# Patient Record
Sex: Male | Born: 1950 | Race: Black or African American | Hispanic: No | Marital: Married | Smoking: Former smoker
Health system: Southern US, Community
[De-identification: ages and names within clinical notes are randomized; demographics above are authoritative.]

## PROBLEM LIST (undated history)

## (undated) DIAGNOSIS — E785 Hyperlipidemia, unspecified: Secondary | ICD-10-CM

## (undated) DIAGNOSIS — J45909 Unspecified asthma, uncomplicated: Secondary | ICD-10-CM

## (undated) DIAGNOSIS — I10 Essential (primary) hypertension: Secondary | ICD-10-CM

## (undated) DIAGNOSIS — E119 Type 2 diabetes mellitus without complications: Secondary | ICD-10-CM

## (undated) DIAGNOSIS — G4730 Sleep apnea, unspecified: Secondary | ICD-10-CM

## (undated) DIAGNOSIS — R079 Chest pain, unspecified: Secondary | ICD-10-CM

## (undated) HISTORY — DX: Chest pain, unspecified: R07.9

## (undated) HISTORY — DX: Hyperlipidemia, unspecified: E78.5

## (undated) HISTORY — DX: Unspecified asthma, uncomplicated: J45.909

## (undated) HISTORY — DX: Essential (primary) hypertension: I10

## (undated) HISTORY — DX: Sleep apnea, unspecified: G47.30

---

## 2000-03-17 ENCOUNTER — Encounter: Payer: Self-pay | Admitting: Cardiology

## 2000-03-17 ENCOUNTER — Ambulatory Visit (HOSPITAL_COMMUNITY): Admission: RE | Admit: 2000-03-17 | Discharge: 2000-03-17 | Payer: Self-pay | Admitting: Cardiology

## 2003-09-20 ENCOUNTER — Emergency Department (HOSPITAL_COMMUNITY): Admission: EM | Admit: 2003-09-20 | Discharge: 2003-09-20 | Payer: Self-pay | Admitting: Emergency Medicine

## 2003-10-03 ENCOUNTER — Encounter (HOSPITAL_COMMUNITY): Admission: RE | Admit: 2003-10-03 | Discharge: 2003-11-02 | Payer: Self-pay | Admitting: Preventative Medicine

## 2003-10-08 ENCOUNTER — Encounter (HOSPITAL_COMMUNITY): Admission: RE | Admit: 2003-10-08 | Discharge: 2003-11-07 | Payer: Self-pay | Admitting: Preventative Medicine

## 2003-11-07 ENCOUNTER — Ambulatory Visit: Payer: Self-pay | Admitting: Family Medicine

## 2003-11-12 ENCOUNTER — Ambulatory Visit: Payer: Self-pay | Admitting: Family Medicine

## 2004-12-03 ENCOUNTER — Ambulatory Visit: Payer: Self-pay | Admitting: Family Medicine

## 2005-01-01 ENCOUNTER — Ambulatory Visit: Payer: Self-pay | Admitting: Family Medicine

## 2005-01-28 ENCOUNTER — Ambulatory Visit: Payer: Self-pay | Admitting: Cardiology

## 2005-02-05 ENCOUNTER — Ambulatory Visit: Payer: Self-pay | Admitting: Cardiology

## 2005-02-15 ENCOUNTER — Ambulatory Visit: Payer: Self-pay | Admitting: Cardiology

## 2005-02-17 ENCOUNTER — Inpatient Hospital Stay (HOSPITAL_BASED_OUTPATIENT_CLINIC_OR_DEPARTMENT_OTHER): Admission: RE | Admit: 2005-02-17 | Discharge: 2005-02-17 | Payer: Self-pay | Admitting: Cardiology

## 2005-02-17 ENCOUNTER — Ambulatory Visit: Payer: Self-pay | Admitting: Cardiology

## 2005-03-03 ENCOUNTER — Ambulatory Visit: Payer: Self-pay | Admitting: Cardiology

## 2005-08-13 ENCOUNTER — Ambulatory Visit: Payer: Self-pay | Admitting: Cardiology

## 2006-03-30 ENCOUNTER — Ambulatory Visit: Payer: Self-pay | Admitting: Family Medicine

## 2006-05-04 ENCOUNTER — Ambulatory Visit: Payer: Self-pay | Admitting: Cardiology

## 2006-05-05 ENCOUNTER — Ambulatory Visit: Payer: Self-pay | Admitting: Family Medicine

## 2006-06-23 ENCOUNTER — Ambulatory Visit: Payer: Self-pay | Admitting: Family Medicine

## 2010-05-22 NOTE — Assessment & Plan Note (Signed)
[facility]                            [facility] OFFICE NOTE   NAME:[name], [name]                        MRN:          [id number]  DATE:08/13/2005                            DOB:          [birth year]    HISTORY OF PRESENT ILLNESS:  The patient is a 60 year old, African-American  male who underwent recent catheterization.  He had no significant coronary  artery disease.  The patient does have known significant hypertension which  is under good control with Isoptin and hydrochlorothiazide.  The patient  denies any chest pain.  He has no recurrent palpitations.  His EKG does show  occasional PACs.  The patient is also in the process of being evaluated for  sleep apnea.  He presents for followup, and he has been doing well.   MEDICATIONS:  Flovent inhaler b.i.d., aspirin 81 mg a day,  hydrochlorothiazide 25 mg p.o. daily, Isoptin SR 120 mg p.o. daily.   PHYSICAL EXAMINATION:  VITAL SIGNS:  Blood pressure is 170/80, but  reportedly the patient's blood pressure was 135/80 last time when he was  seen in the clinic; heart rate was 78 beats per minute; weight is 275.  NECK EXAM:  Normal carotid upstroke.  No carotid bruits.  LUNGS:  Clear.  HEART:  Regular rate and rhythm.  Normal sinus rhythm.  No murmurs, rubs, or  gallops.  ABDOMEN:  Soft.  EXTREMITY EXAM:  No cyanosis, clubbing, or edema.  NEURO:  The patient is alert and oriented.  Grossly nonfocal.   EKG:  Normal sinus rhythm with PACs.   PROBLEM LIST:  1. Atypical substernal chest pain.      a.     Normal cardiac catheterization with normal coronary arteries.      b.     False-positive Cardiolite stress study.      c.     Normal ejection fraction.  2. Severe hypertension on hydrochlorothiazide and verapamil.  3. Palpitations, resolved (PACs by EKG).  4. Asthma.  5. Dyslipidemia.  6. Rule out sleep apnea.   PLAN:  1. The patient is to continue his current medical regimen.  He may  need      further adjustment of his blood pressure medications in the near      future, although his blood pressure during the last clinic visit was      well controlled.  The patient did not take his medications yet today.  2. The patient has no recurrent palpitations.  He does have PACs by EKG,      but this can be      treated medically.  3. The patient will have an ApneaLink done to rule out sleep apnea.                                   [name], MD, [facility]   GED/MedQ  DD:  08/13/2005  DT:  08/13/2005  Job #:  [id number]   cc:   Delaney Meigs, MD

## 2010-05-22 NOTE — Cardiovascular Report (Signed)
NAME:  [name], [name] NO.:  [id number]   MEDICAL RECORD NO.:  [id number]          PATIENT TYPE:  OIB   LOCATION:  1965                         FACILITY:  [facility]   PHYSICIAN:  Charlies Constable, M.D. [facility] DATE OF BIRTH:  1950/05/18   DATE OF PROCEDURE:  02/17/2005  DATE OF DISCHARGE:                              CARDIAC CATHETERIZATION   [name] is 60 years old and lives in [place] and works for [employer].  He recently has developed left-sided chest pain and had a Cardiolite scan  done by Dr. Andee Lineman, which showed an inferior scar but no definite ischemia.  He had a hypertensive response to exercise with the Myoview scan.  He also  has a history of hypertension.  Because of persistent symptoms and an  abnormal scan, Dr. Andee Lineman made arrangements for him to be evaluated in the  outpatient laboratory with cardiac catheterization.   PROCEDURE:  The procedure was performed via the right femoral artery using  an arterial sheath and 4 French preformed coronary catheters.  A front wall  arterial puncture was performed and Omnipaque contrast was used.  The  patient tolerated the procedure well and left the laboratory in satisfactory  condition.   RESULTS:  1.  The aortic pressure was 109/66 with a mean of 86.  2.  The left ventricular pressure was 109/10.  3.  Left main coronary artery:  The left main coronary artery was free of      significant disease.  4.  Left anterior descending artery:  The left anterior descending artery      gave rise to four diagonal branches and three septal perforators.  These      and the LAD proper were free of significant disease.  5.  Circumflex artery:  The circumflex artery gave rise to a ramus branch,      an atrial branch, a marginal branch and a posterolateral branch.  These      vessels were free of significant disease.  6.  Right coronary artery:  The right coronary artery was a large, dominant      vessel that gave rise to dual  posterior descending branches and two      posterolateral branches.  These vessels were free of significant      disease.  7.  Left ventriculogram:  The left ventriculogram performed in the RAO      projection showed good wall motion with no areas of hypokinesis.  The      inferior wall moved well.  The estimated ejection fraction was 55%.  8.  Distal aortogram:  A distal aortogram was performed that showed patent      renal arteries with no renal obstruction and no significant a aortoiliac      obstruction.   CONCLUSION:  Normal coronary angiography and left ventricular wall motion.   RECOMMENDATIONS:  Reassurance.  Will make arrangements for the patient to  see Dr. Andee Lineman in follow-up.  It does not appear to be cardiac in etiology,  and Dr. Andee Lineman can decide about further evaluation.  ______________________________  Charlies Constable, M.D. [facility]     BB/MEDQ  D:  02/17/2005  T:  02/17/2005  Job:  [id number]   cc:   Delaney Meigs, M.D.  Fax: [phone]   [name], M.D. [facility]  [address]  [address]  [place]  [place] [address]   Cardiopulmonary Lab

## 2010-05-22 NOTE — Assessment & Plan Note (Signed)
[facility]                          [facility] OFFICE NOTE   NAME:[name]                        MRN:          [id number]  DATE:05/04/2006                            DOB:          1950-06-27    REFERRING PHYSICIAN:  Delaney Meigs, M.D.   HISTORY OF PRESENT ILLNESS:  The patient is a pleasant African-American  male with a history of hypertensive heart disease.  The patient's blood  pressure is now under good control.  He has been ruled out for ischemic  heart disease and previous cardiac catheterizations within normal  limits.  The patient reports some cramping in the legs, but has no  evidence of claudication.  He still has no chart from the ApneaLink  referral.  We had our Link representative in the office today, and I  have asked her to make sure that the patient receives an apnea monitor  to rule out sleep apnea.   PHYSICAL EXAMINATION:  VITAL SIGNS:  Blood pressure 143/82, heart rate  69 beats per minute, weight 268 pounds.  NECK:  Normal carotid upstroke, no carotid bruits.  LUNGS:  Clear breath sounds bilaterally.  HEART:  Regular rate and rhythm, normal S1, S2.  No murmurs, rubs or  gallops.  ABDOMEN:  Soft, nontender.  No rebound tenderness or guarding.  Good  bowel sounds.  EXTREMITIES:  No cyanosis, clubbing or edema.   PROBLEM LIST:  1. Hypertensive heart disease.  2. Hypertension controlled.  3. No evidence of coronary artery disease.  4. Rule out sleep apnea.   PLAN:  1. The patient can follow up with [place] in six months.  2. The patient will see Dr. Lysbeth Galas tomorrow, and I have provided      records to give to Dr. Lysbeth Galas.  3. The patient can continue on his current medical regimen.  4. An ApneaLink monitor has been again ordered.     [name], MD,FACC     GED/MedQ  DD: 05/04/2006  DT: 05/04/2006  Job #: [id number]   cc:   Delaney Meigs, M.D.

## 2010-05-22 NOTE — Cardiovascular Report (Signed)
[place]  Patient:    [name], [name]                        MRN: [id number] Proc. Date: 03/17/00 Adm. Date:  [id number] Attending:  Ronaldo Miyamoto CC:         Colon Flattery, D.O.  Rollene Rotunda, M.D. [facility]  CV Laboratory   Cardiac Catheterization  INDICATIONS:  A 60 year old gentleman with recent chest pain.  There is positive family history.  He has a history of asthma.  The current study was done to assess coronary anatomy.  PROCEDURE: 1. Left heart catheterization. 2. Selective coronary arteriography. 3. Selective left ventriculography.  CARDIOLOGIST:  Arturo Morton. Riley Kill, M.D. [facility]  DESCRIPTION OF THE PROCEDURE:  The procedure was performed from the right femora artery using 6-French catheter.  He tolerated the procedure well without complications.  He was taken to the holding area in satisfactory clinical condition.  HEMODYNAMIC DATA: The central aortic pressure was 152/95.  LV pressure 161/23.No gradient on pullback across the aortic valve.  ANGIOGRAPHIC DATA:  The left main coronary artery was free of critical disease.  The left anterior descending artery courses to the apex.  There were three diagonal branches all of modest size.  No significant high-grade stenoses were noted.  The circumflex provided his large marginal branch and an A-V circumflex which bifurcated.  The distal circumflex was a relatively small caliber vessel.  The large marginal was free of significant disease.  The right coronary artery is a dominant vessel.  It provided a posterior descending and posterolateral branch.  There was perhaps a very mild tapered narrowing of 10 to 20% near the junction of the proximal and mid vessel.  This did not appear to be flow limiting and did not appear to be significant.  Ventriculography in the RAO projection reveals preserved global systolic function.  Ejection fraction was 74%.  No segmental abnormalities  or contractures were identified.  CONCLUSIONS: 1. Normal left ventricular function. 2. No high-grade coronary artery stenosis.  DISPOSITION:  Follow up with Dr. Antoine Poche and Dr. Dewaine Conger. DD:  03/17/00 TD:  03/17/00 Job: [id number]

## 2010-05-22 NOTE — Letter (Signed)
August 12, 2005     Occupational Health  [facility]   RE:  [name], [name]  MRN:  [id number]  /  DOB:  1950/07/20   Dear Carmelina Dane,   Mr. Trost is a 60 year old African-American male who was evaluated with  atypical chest pain.  A Cardiolite study was obtained, which showed an  inferior defect.  Based on this study, the patient was recommended to  undergo cardiac catheterization.  In retrospect, it appeared that this  Cardiolite study was a false-positive study.  His cardiac catheterization  was entirely within normal limits with no evidence of significant epicardial  coronary artery disease.  He also had normal ventricular volumes and normal  intracardiac pressures.  The patient did have significant hypertension, but  this was rapidly controlled under hydrochlorothiazide.  As noted above,  during the catheterization, it was found that his blood pressure was within  normal limits.  The patient plans to undergo an evaluation for sleep apnea  but has not reported any syncopal episodes or falling asleep while operating  a motorized vehicle.  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   If you have any further questions, please do not hesitate to contact me.   Sincerely,     [name], MD, [facility]   GED/MedQ  DD:  08/12/2005  DT:  08/12/2005  Job #:  [id number]

## 2013-03-06 ENCOUNTER — Encounter: Payer: Self-pay | Admitting: *Deleted

## 2013-03-07 ENCOUNTER — Encounter: Payer: Self-pay | Admitting: Cardiology

## 2013-03-07 ENCOUNTER — Ambulatory Visit (INDEPENDENT_AMBULATORY_CARE_PROVIDER_SITE_OTHER): Payer: BC Managed Care – PPO | Admitting: Cardiology

## 2013-03-07 VITALS — BP 153/94 | HR 60 | Ht 75.0 in | Wt 260.0 lb

## 2013-03-07 DIAGNOSIS — R002 Palpitations: Secondary | ICD-10-CM | POA: Insufficient documentation

## 2013-03-07 DIAGNOSIS — Z136 Encounter for screening for cardiovascular disorders: Secondary | ICD-10-CM

## 2013-03-07 NOTE — Patient Instructions (Signed)
Your physician recommends that you schedule a follow-up appointment in: 1 month with Dr. Harl Bowie. This appointment will be scheduled today before you leave.   Your physician recommends that you continue on your current medications as directed. Please refer to the Current Medication list given to you today.  Your physician has recommended that you wear an event monitor for 7 days. Event monitors are medical devices that record the heart's electrical activity. Doctors most often [place] these monitors to diagnose arrhythmias. Arrhythmias are problems with the speed or rhythm of the heartbeat. The monitor is a small, portable device. You can wear one while you do your normal daily activities. This is usually used to diagnose what is causing palpitations/syncope (passing out).

## 2013-03-07 NOTE — Progress Notes (Signed)
Clinical Summary Mr. Chandler is a 63 y.o.male seen today as a new patient, he is referred for palpitations.   1. Palpitations - feeling of heart fluttering. Started Jan 2015. No other associated symptoms.  - occurs more often at night, lasts just a few minutes. Occurs every few days. Can occur at rest or with activity. Chronic SOB he attributes to his asthma that is unchanged - drinks coffee 1-2 cups, drinks ice tea or pepsi with meals. No EtoH - currently on verapamil and Toprol, Toprol started just recently without much change in symptoms.    Past Medical History  Diagnosis Date  . Dyslipidemia   . Asthma   . Sleep apnea   . Hypertension      Allergies  Allergen Reactions  . Sulfa Antibiotics      Current Outpatient Prescriptions  Medication Sig Dispense Refill  . aspirin 81 MG tablet Take 81 mg by mouth daily.      . Cholecalciferol (VITAMIN D3) 2000 UNITS TABS Take 1 tablet by mouth daily.      . hydrochlorothiazide (HYDRODIURIL) 25 MG tablet Take 25 mg by mouth daily.      . meloxicam (MOBIC) 15 MG tablet Take 15 mg by mouth daily as needed for pain.      . metFORMIN (GLUCOPHAGE) 500 MG tablet Take 500 mg by mouth daily with breakfast.      . mometasone (ASMANEX) 220 MCG/INH inhaler Inhale 2 puffs into the lungs daily.      . verapamil (VERELAN PM) 240 MG 24 hr capsule Take 240 mg by mouth at bedtime.       No current facility-administered medications for this visit.     No past surgical history on file.   Allergies  Allergen Reactions  . Sulfa Antibiotics       No family history on file.   Social History Mr. Cybulski reports that he quit smoking about 26 years ago. His smoking use included Cigarettes. He started smoking about 37 years ago. He smoked 0.00 packs per day. He does not have any smokeless tobacco history on file. Mr. Gassman reports that he does not drink alcohol.   Review of Systems CONSTITUTIONAL: No weight loss, fever, chills, weakness  or fatigue.  HEENT: Eyes: No visual loss, blurred vision, double vision or yellow sclerae.No hearing loss, sneezing, congestion, runny nose or sore throat.  SKIN: No rash or itching.  CARDIOVASCULAR: per HPI RESPIRATORY: No shortness of breath, cough or sputum.  GASTROINTESTINAL: No anorexia, nausea, vomiting or diarrhea. No abdominal pain or blood.  GENITOURINARY: No burning on urination, no polyuria NEUROLOGICAL: No headache, dizziness, syncope, paralysis, ataxia, numbness or tingling in the extremities. No change in bowel or bladder control.  MUSCULOSKELETAL: No muscle, back pain, joint pain or stiffness.  LYMPHATICS: No enlarged nodes. No history of splenectomy.  PSYCHIATRIC: No history of depression or anxiety.  ENDOCRINOLOGIC: No reports of sweating, cold or heat intolerance. No polyuria or polydipsia.  [name]   Physical Examination p 60 bp 153/94 Wt 260 lbs BMI 33 Gen: resting comfortably, no acute distress HEENT: no scleral icterus, pupils equal round and reactive, no palptable cervical adenopathy,  CV: RRR, no m/r/g, no JVD, no carotid bruits Resp: Clear to auscultation bilaterally GI: abdomen is soft, non-tender, non-distended, normal bowel sounds, no hepatosplenomegaly MSK: extremities are warm, no edema.  Skin: warm, no rash Neuro:  no focal deficits Psych: appropriate affect   Diagnostic Studies 03/2000 Cath HEMODYNAMIC DATA: The central aortic  pressure was 152/95. LV pressure  161/23.No gradient on pullback across the aortic valve.  ANGIOGRAPHIC DATA:  The left main coronary artery was free of critical disease.  The left anterior descending artery courses to the apex. There were three  diagonal branches all of modest size. No significant high-grade stenoses were  noted.  The circumflex provided his large marginal Vaudie Engebretsen and an A-V circumflex which  bifurcated. The distal circumflex was a relatively small caliber vessel. The  large marginal was free of significant  disease.  The right coronary artery is a dominant vessel. It provided a posterior  descending and posterolateral Tamya Denardo. There was perhaps a very mild tapered  narrowing of 10 to 20% near the junction of the proximal and mid vessel. This  did not appear to be flow limiting and did not appear to be significant.  Ventriculography in the RAO projection reveals preserved global systolic  function. Ejection fraction was 74%. No segmental abnormalities or  contractures were identified.  CONCLUSIONS:  1. Normal left ventricular function.  2. No high-grade coronary artery stenosis.  DISPOSITION: Follow up with Dr. [name] and Dr. [name].  02/2005 Cath RESULTS:  1. The aortic pressure was 109/66 with a mean of 86.  2. The left ventricular pressure was 109/10.  3. Left main coronary artery: The left main coronary artery was free of  significant disease.  4. Left anterior descending artery: The left anterior descending artery  gave rise to four diagonal branches and three septal perforators. These  and the LAD proper were free of significant disease.  5. Circumflex artery: The circumflex artery gave rise to a ramus Jocelyne Reinertsen,  an atrial Sofiah Lyne, a marginal Doyt Castellana and a posterolateral Emerlyn Mehlhoff. These  vessels were free of significant disease.  6. Right coronary artery: The right coronary artery was a large, dominant  vessel that gave rise to dual posterior descending branches and two  posterolateral branches. These vessels were free of significant  disease.  7. Left ventriculogram: The left ventriculogram performed in the RAO  projection showed good wall motion with no areas of hypokinesis. The  inferior wall moved well. The estimated ejection fraction was 55%.  8. Distal aortogram: A distal aortogram was performed that showed patent  renal arteries with no renal obstruction and no significant a aortoiliac  obstruction.  CONCLUSION: Normal coronary angiography and left ventricular wall motion.    RECOMMENDATIONS: Reassurance. Will make arrangements for the patient to  see Dr. Dannielle Burn in follow-up. It does not appear to be cardiac in etiology,  and Dr. Dannielle Burn can decide about further evaluation.    03/07/13 Clinic EKG NSR, PACs  Assessment and Plan  1. Palpitatons - will obtain 7 day event monitor - counseled to decrease caffeine intake - will obtain labs from pcp  Follow up 1 month   Arnoldo Lenis, M.D., F.A.C.C.

## 2013-03-12 ENCOUNTER — Telehealth: Payer: Self-pay | Admitting: Cardiology

## 2013-03-12 NOTE — Telephone Encounter (Signed)
Received a fax from [employer] stating that they are unable to verify pt address after several attempts of calling. I called pt home and cell phone numbers and left messages informing pt to call [employer] at [phone] to verify his address with them.

## 2013-03-14 DIAGNOSIS — R002 Palpitations: Secondary | ICD-10-CM

## 2013-03-28 ENCOUNTER — Telehealth: Payer: Self-pay | Admitting: Cardiology

## 2013-03-28 NOTE — Telephone Encounter (Signed)
Pt informed of monitor results and verbalized understanding.

## 2013-04-10 ENCOUNTER — Encounter: Payer: Self-pay | Admitting: Cardiology

## 2013-04-10 ENCOUNTER — Ambulatory Visit (INDEPENDENT_AMBULATORY_CARE_PROVIDER_SITE_OTHER): Payer: BC Managed Care – PPO | Admitting: Cardiology

## 2013-04-10 VITALS — BP 141/71 | HR 59 | Ht 75.0 in | Wt 261.0 lb

## 2013-04-10 DIAGNOSIS — R002 Palpitations: Secondary | ICD-10-CM

## 2013-04-10 NOTE — Progress Notes (Signed)
Clinical Summary [name] is a 63 y.o.male seen today for follow up of the following medical problems.   1. Palpitations  - occurs more often at night, lasts just a few minutes.  - Since last visit has cut back on caffeine.  - currently on verapamil and Toprol, reports symptoms have improved since last visit.   - since last visit obtained 7 day event monitor which showed  NSR, sinus brady high 50s, and occasional PACs correlating with his symptoms. No significant arrhythmias.    Past Medical History  Diagnosis Date  . Dyslipidemia   . Asthma   . Sleep apnea   . Hypertension      Allergies  Allergen Reactions  . Sulfa Antibiotics      Current Outpatient Prescriptions  Medication Sig Dispense Refill  . aspirin 81 MG tablet Take 81 mg by mouth daily.      . Cholecalciferol (VITAMIN D3) 2000 UNITS TABS Take 1 tablet by mouth daily.      . hydrochlorothiazide (HYDRODIURIL) 25 MG tablet Take 25 mg by mouth daily.      . meloxicam (MOBIC) 15 MG tablet Take 15 mg by mouth daily as needed for pain.      . metFORMIN (GLUCOPHAGE) 500 MG tablet Take 500 mg by mouth daily with breakfast.      . metoprolol succinate (TOPROL-XL) 25 MG 24 hr tablet Take 1 tablet by mouth daily.      . mometasone (ASMANEX) 220 MCG/INH inhaler Inhale 2 puffs into the lungs daily.      . verapamil (VERELAN PM) 240 MG 24 hr capsule Take 240 mg by mouth at bedtime.       No current facility-administered medications for this visit.     No past surgical history on file.   Allergies  Allergen Reactions  . Sulfa Antibiotics       No family history on file.   Social History [name] reports that he quit smoking about 26 years ago. His smoking use included Cigars. He started smoking about 37 years ago. He has never used smokeless tobacco. [name] reports that he does not drink alcohol.   Review of Systems CONSTITUTIONAL: No weight loss, fever, chills, weakness or fatigue.  HEENT:  Eyes: No visual loss, blurred vision, double vision or yellow sclerae.No hearing loss, sneezing, congestion, runny nose or sore throat.  SKIN: No rash or itching.  CARDIOVASCULAR: per HPI RESPIRATORY: No shortness of breath, cough or sputum.  GASTROINTESTINAL: No anorexia, nausea, vomiting or diarrhea. No abdominal pain or blood.  GENITOURINARY: No burning on urination, no polyuria NEUROLOGICAL: No headache, dizziness, syncope, paralysis, ataxia, numbness or tingling in the extremities. No change in bowel or bladder control.  MUSCULOSKELETAL: No muscle, back pain, joint pain or stiffness.  LYMPHATICS: No enlarged nodes. No history of splenectomy.  PSYCHIATRIC: No history of depression or anxiety.  ENDOCRINOLOGIC: No reports of sweating, cold or heat intolerance. No polyuria or polydipsia.  [name]   Physical Examination p 59 bp 141/71 Wt 261 lbs BMI 33 Gen: resting comfortably, no acute distress HEENT: no scleral icterus, pupils equal round and reactive, no palptable cervical adenopathy,  CV: RRR, no m/r/g, no JVD, no carotid bruits Resp: Clear to auscultation bilaterally GI: abdomen is soft, non-tender, non-distended, normal bowel sounds, no hepatosplenomegaly MSK: extremities are warm, no edema.  Skin: warm, no rash Neuro:  no focal deficits Psych: appropriate affect   Diagnostic Studies 03/2000 Cath  HEMODYNAMIC DATA: The central  aortic pressure was 152/95. LV pressure  161/23.No gradient on pullback across the aortic valve.  ANGIOGRAPHIC DATA:  The left main coronary artery was free of critical disease.  The left anterior descending artery courses to the apex. There were three  diagonal branches all of modest size. No significant high-grade stenoses were  noted.  The circumflex provided his large marginal Myrta Mercer and an A-V circumflex which  bifurcated. The distal circumflex was a relatively small caliber vessel. The  large marginal was free of significant disease.  The right  coronary artery is a dominant vessel. It provided a posterior  descending and posterolateral Jaiyla Granados. There was perhaps a very mild tapered  narrowing of 10 to 20% near the junction of the proximal and mid vessel. This  did not appear to be flow limiting and did not appear to be significant.  Ventriculography in the RAO projection reveals preserved global systolic  function. Ejection fraction was 74%. No segmental abnormalities or  contractures were identified.  CONCLUSIONS:  1. Normal left ventricular function.  2. No high-grade coronary artery stenosis.  DISPOSITION: Follow up with Dr. [name] and Dr. [name].  02/2005 Cath  RESULTS:  1. The aortic pressure was 109/66 with a mean of 86.  2. The left ventricular pressure was 109/10.  3. Left main coronary artery: The left main coronary artery was free of  significant disease.  4. Left anterior descending artery: The left anterior descending artery  gave rise to four diagonal branches and three septal perforators. These  and the LAD proper were free of significant disease.  5. Circumflex artery: The circumflex artery gave rise to a ramus Lailee Hoelzel,  an atrial Sterling Ucci, a marginal Amiee Wiley and a posterolateral Deryl Ports. These  vessels were free of significant disease.  6. Right coronary artery: The right coronary artery was a large, dominant  vessel that gave rise to dual posterior descending branches and two  posterolateral branches. These vessels were free of significant  disease.  7. Left ventriculogram: The left ventriculogram performed in the RAO  projection showed good wall motion with no areas of hypokinesis. The  inferior wall moved well. The estimated ejection fraction was 55%.  8. Distal aortogram: A distal aortogram was performed that showed patent  renal arteries with no renal obstruction and no significant a aortoiliac  obstruction.  CONCLUSION: Normal coronary angiography and left ventricular wall motion.  RECOMMENDATIONS:  Reassurance. Will make arrangements for the patient to  see Dr. Dannielle Burn in follow-up. It does not appear to be cardiac in etiology,  and Dr. Dannielle Burn can decide about further evaluation.   03/07/13 Clinic EKG  NSR, PACs        Assessment and Plan  1. Palpitatons  - since last visit symptoms have improved - no significant arrhythmias from event monitor - continue current medications - will check TSH        Arnoldo Lenis, M.D., F.A.C.C.

## 2013-04-10 NOTE — Patient Instructions (Signed)
Your physician recommends that you schedule a follow-up appointment in: 1 year with Dr. Harl Bowie. You should receive a letter in the mail in 10 months. If you do not receive this letter by February 2016 call our office to schedule this appointment.   Your physician recommends that you continue on your current medications as directed. Please refer to the Current Medication list given to you today.  Your physician recommends that you return for lab work in: today for TSH  You may go to one of the following locations to have lab work completed: [facility] [place] [place] [address] Dr. Edrick Oh office in [facility].

## 2013-04-18 ENCOUNTER — Telehealth: Payer: Self-pay | Admitting: Cardiology

## 2013-04-18 NOTE — Telephone Encounter (Signed)
LM for pt to returncall

## 2013-04-18 NOTE — Telephone Encounter (Signed)
Message copied by Lewayne Bunting on Wed Apr 18, 2013  3:14 PM ------      Message from: Fabens F      Created: Wed Apr 18, 2013  2:32 PM       Normal thyroid test            Zandra Abts MD ------

## 2013-04-19 NOTE — Telephone Encounter (Signed)
Pt informed of results and verbalized understanding.  

## 2014-06-26 ENCOUNTER — Ambulatory Visit (INDEPENDENT_AMBULATORY_CARE_PROVIDER_SITE_OTHER): Payer: BC Managed Care – PPO | Admitting: Cardiology

## 2014-06-26 ENCOUNTER — Encounter: Payer: Self-pay | Admitting: Cardiology

## 2014-06-26 VITALS — BP 128/77 | HR 54 | Ht 75.0 in | Wt 266.0 lb

## 2014-06-26 DIAGNOSIS — R002 Palpitations: Secondary | ICD-10-CM | POA: Diagnosis not present

## 2014-06-26 MED ORDER — METOPROLOL SUCCINATE ER 25 MG PO TB24: 25.0000 mg | ORAL_TABLET | Freq: Every day | ORAL | Status: DC

## 2014-06-26 NOTE — Patient Instructions (Signed)
Your physician wants you to follow-up in: [place] DR. BRANCH You will receive a reminder letter in the mail two months in advance. If you don't receive a letter, please call our office to schedule the follow-up appointment.  Your physician has recommended you make the following change in your medication:   STOP LOPRESSOR   START TOPROL XL 25 MG DAILY  CONTINUE ALL OTHER MEDICATIONS AS DIRECTED  Thank you for choosing [facility]!!

## 2014-06-26 NOTE — Progress Notes (Signed)
Clinical Summary Mr. Toledo is a 64 y.o.male seen today for follow up of the following medical problems.   1. Palpitations  - 7 day event monitor which showed NSR, sinus brady high 50s, and occasional PACs correlating with his symptoms. - he had done well on verapamil and Toprol XL, he states however when he changed his meds over to the [place] his Toprol XL was changed to lopressor 12.5mg  bid and since then symptoms have started again.  ,    Past Medical History  Diagnosis Date  . Dyslipidemia   . Asthma   . Sleep apnea   . Hypertension      Allergies  Allergen Reactions  . Sulfa Antibiotics      Current Outpatient Prescriptions  Medication Sig Dispense Refill  . aspirin 81 MG tablet Take 81 mg by mouth daily.    . Cholecalciferol (VITAMIN D3) 2000 UNITS TABS Take 1 tablet by mouth daily.    . hydrochlorothiazide (HYDRODIURIL) 25 MG tablet Take 25 mg by mouth daily.    . meloxicam (MOBIC) 15 MG tablet Take 15 mg by mouth daily as needed for pain.    . metFORMIN (GLUCOPHAGE) 500 MG tablet Take 500 mg by mouth daily with breakfast.    . metoprolol succinate (TOPROL-XL) 25 MG 24 hr tablet Take 1 tablet by mouth daily.    . mometasone (ASMANEX) 220 MCG/INH inhaler Inhale 2 puffs into the lungs daily.    . tamsulosin (FLOMAX) 0.4 MG CAPS capsule Take 0.4 mg by mouth daily.    . verapamil (VERELAN PM) 240 MG 24 hr capsule Take 240 mg by mouth at bedtime.     No current facility-administered medications for this visit.     No past surgical history on file.   Allergies  Allergen Reactions  . Sulfa Antibiotics       No family history on file.   Social History Mr. Bellerose reports that he quit smoking about 27 years ago. His smoking use included Cigars. He started smoking about 38 years ago. He has never used smokeless tobacco. Mr. Yaffe reports that he does not drink alcohol.   Review of Systems CONSTITUTIONAL: No weight loss, fever, chills, weakness or fatigue.   HEENT: Eyes: No visual loss, blurred vision, double vision or yellow sclerae.No hearing loss, sneezing, congestion, runny nose or sore throat.  SKIN: No rash or itching.  CARDIOVASCULAR: per HPI RESPIRATORY: No shortness of breath, cough or sputum.  GASTROINTESTINAL: No anorexia, nausea, vomiting or diarrhea. No abdominal pain or blood.  GENITOURINARY: No burning on urination, no polyuria NEUROLOGICAL: No headache, dizziness, syncope, paralysis, ataxia, numbness or tingling in the extremities. No change in bowel or bladder control.  MUSCULOSKELETAL: No muscle, back pain, joint pain or stiffness.  LYMPHATICS: No enlarged nodes. No history of splenectomy.  PSYCHIATRIC: No history of depression or anxiety.  ENDOCRINOLOGIC: No reports of sweating, cold or heat intolerance. No polyuria or polydipsia.  [name]   Physical Examination Filed Vitals:   06/26/14 0952  BP: 128/77  Pulse: 54   Filed Vitals:   06/26/14 0952  Height: 6\' 3"  (1.905 m)  Weight: 266 lb (120.657 kg)    Gen: resting comfortably, no acute distress HEENT: no scleral icterus, pupils equal round and reactive, no palptable cervical adenopathy,  CV: RRR, no m/r/g, no JVD Resp: Clear to auscultation bilaterally GI: abdomen is soft, non-tender, non-distended, normal bowel sounds, no hepatosplenomegaly MSK: extremities are warm, no edema.  Skin: warm, no rash Neuro:  no focal deficits Psych: appropriate affect   Diagnostic Studies  03/2000 Cath  HEMODYNAMIC DATA: The central aortic pressure was 152/95. LV pressure  161/23.No gradient on pullback across the aortic valve.  ANGIOGRAPHIC DATA:  The left main coronary artery was free of critical disease.  The left anterior descending artery courses to the apex. There were three  diagonal branches all of modest size. No significant high-grade stenoses were  noted.  The circumflex provided his large marginal Leyna Vanderkolk and an A-V circumflex which  bifurcated. The distal  circumflex was a relatively small caliber vessel. The  large marginal was free of significant disease.  The right coronary artery is a dominant vessel. It provided a posterior  descending and posterolateral Shivan Hodes. There was perhaps a very mild tapered  narrowing of 10 to 20% near the junction of the proximal and mid vessel. This  did not appear to be flow limiting and did not appear to be significant.  Ventriculography in the RAO projection reveals preserved global systolic  function. Ejection fraction was 74%. No segmental abnormalities or  contractures were identified.  CONCLUSIONS:  1. Normal left ventricular function.  2. No high-grade coronary artery stenosis.  DISPOSITION: Follow up with Dr. [name] and Dr. [name].  02/2005 Cath  RESULTS:  1. The aortic pressure was 109/66 with a mean of 86.  2. The left ventricular pressure was 109/10.  3. Left main coronary artery: The left main coronary artery was free of  significant disease.  4. Left anterior descending artery: The left anterior descending artery  gave rise to four diagonal branches and three septal perforators. These  and the LAD proper were free of significant disease.  5. Circumflex artery: The circumflex artery gave rise to a ramus Nolene Rocks,  an atrial Jatavius Ellenwood, a marginal Fusae Florio and a posterolateral Babatunde Seago. These  vessels were free of significant disease.  6. Right coronary artery: The right coronary artery was a large, dominant  vessel that gave rise to dual posterior descending branches and two  posterolateral branches. These vessels were free of significant  disease.  7. Left ventriculogram: The left ventriculogram performed in the RAO  projection showed good wall motion with no areas of hypokinesis. The  inferior wall moved well. The estimated ejection fraction was 55%.  8. Distal aortogram: A distal aortogram was performed that showed patent  renal arteries with no renal obstruction  and no significant a aortoiliac  obstruction.  CONCLUSION: Normal coronary angiography and left ventricular wall motion.  RECOMMENDATIONS: Reassurance. Will make arrangements for the patient to  see Dr. Dannielle Burn in follow-up. It does not appear to be cardiac in etiology,  and Dr. Dannielle Burn can decide about further evaluation.   03/07/13 Clinic EKG  NSR, PACs          Assessment and Plan  1. Palpitatons  - symptoms increasing since his Toprol XL was changed to lopressor at [employer]. Will change back to Toprol XL, he is willing to get from private pharmacy   F/u 1 year      Arnoldo Lenis, M.D

## 2014-07-01 ENCOUNTER — Other Ambulatory Visit: Payer: Self-pay

## 2015-08-08 ENCOUNTER — Ambulatory Visit (INDEPENDENT_AMBULATORY_CARE_PROVIDER_SITE_OTHER): Payer: BC Managed Care – PPO | Admitting: Cardiology

## 2015-08-08 ENCOUNTER — Encounter: Payer: Self-pay | Admitting: Cardiology

## 2015-08-08 ENCOUNTER — Encounter: Payer: Self-pay | Admitting: *Deleted

## 2015-08-08 VITALS — BP 138/83 | HR 57 | Ht 75.0 in | Wt 269.0 lb

## 2015-08-08 DIAGNOSIS — I10 Essential (primary) hypertension: Secondary | ICD-10-CM

## 2015-08-08 DIAGNOSIS — Z136 Encounter for screening for cardiovascular disorders: Secondary | ICD-10-CM | POA: Diagnosis not present

## 2015-08-08 DIAGNOSIS — E785 Hyperlipidemia, unspecified: Secondary | ICD-10-CM

## 2015-08-08 DIAGNOSIS — R002 Palpitations: Secondary | ICD-10-CM

## 2015-08-08 NOTE — Progress Notes (Signed)
Clinical Summary [name] is a 65 y.o.male seen today for follow up of the following medical problems.   1. Palpitations  - 7 day event monitor showed NSR, sinus brady high 50s, and occasional PACs correlating with his symptoms. - he had done well on verapamil and Toprol XL, he states however when he changed his meds over to the [place] his Toprol XL was changed to lopressor 12.5mg  bid and since then symptoms have started again.   - after changing back to Toprol XL he reports symptoms are improved.   2. HTN - does not check at home - compliant with meds  3. 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  Past Medical History:  Diagnosis Date  . Asthma   . Dyslipidemia   . Hypertension   . Sleep apnea      Allergies  Allergen Reactions  . Sulfa Antibiotics      Current Outpatient Prescriptions  Medication Sig Dispense Refill  . aspirin 81 MG tablet Take 81 mg by mouth daily.    . Cholecalciferol (VITAMIN D3) 2000 UNITS TABS Take 1 tablet by mouth daily.    . hydrochlorothiazide (HYDRODIURIL) 25 MG tablet Take 25 mg by mouth daily.    . meloxicam (MOBIC) 15 MG tablet Take 15 mg by mouth daily as needed for pain.    . metFORMIN (GLUCOPHAGE) 500 MG tablet Take 500 mg by mouth daily with breakfast.    . metoprolol succinate (TOPROL-XL) 25 MG 24 hr tablet Take 1 tablet (25 mg total) by mouth daily. 90 tablet 3  . mometasone (ASMANEX) 220 MCG/INH inhaler Inhale 2 puffs into the lungs daily.    . pravastatin (PRAVACHOL) 40 MG tablet Take 1 tablet by mouth daily.    . tamsulosin (FLOMAX) 0.4 MG CAPS capsule Take 0.4 mg by mouth daily.    . verapamil (VERELAN PM) 240 MG 24 hr capsule Take 240 mg by mouth at bedtime.     No current facility-administered medications for this visit.      No past surgical history on file.   Allergies  Allergen Reactions  . Sulfa Antibiotics        Family History  Problem Relation Age of Onset  . Hypertension Mother   . Heart attack Mother   . Stomach cancer Mother   . Heart disease Mother   . Diabetes Father   . Heart attack Father   . Heart disease Father   . Diabetes Sister   . Liver cancer Sister   . Heart attack Brother   . Heart disease Brother   . Heart attack Paternal Grandfather   . Heart disease Paternal Grandfather      Social History [name] reports that he quit smoking about 28 years ago. His smoking use included Cigars. He started smoking about 39 years ago. He has never used smokeless tobacco. [name] reports that he does not drink alcohol.   Review of Systems CONSTITUTIONAL: No weight loss, fever, chills, weakness or fatigue.  HEENT: Eyes: No visual loss, blurred vision, double vision or yellow sclerae.No hearing loss, sneezing, congestion, runny nose or sore throat.  SKIN: No rash or itching.  CARDIOVASCULAR: per HPI RESPIRATORY: No shortness of breath, cough or sputum.  GASTROINTESTINAL: No anorexia, nausea, vomiting or diarrhea. No abdominal pain or blood.  GENITOURINARY: No burning on urination, no polyuria NEUROLOGICAL: No headache, dizziness, syncope,  paralysis, ataxia, numbness or tingling in the extremities. No change in bowel or bladder control.  MUSCULOSKELETAL: No muscle, back pain, joint pain or stiffness.  LYMPHATICS: No enlarged nodes. No history of splenectomy.  PSYCHIATRIC: No history of depression or anxiety.  ENDOCRINOLOGIC: No reports of sweating, cold or heat intolerance. No polyuria or polydipsia.  [name]   Physical Examination Vitals:   08/08/15 1037  BP: 138/83  Pulse: (!) 57   Vitals:   08/08/15 1037  Weight: 269 lb (122 kg)  Height: 6\' 3"  (1.905 m)    Gen: resting comfortably, no acute distress HEENT: no scleral icterus, pupils equal round and reactive, no palptable cervical adenopathy,  CV: RRR, no m/r/g, no jvd Resp: Clear to auscultation  bilaterally GI: abdomen is soft, non-tender, non-distended, normal bowel sounds, no hepatosplenomegaly MSK: extremities are warm, no edema.  Skin: warm, no rash Neuro:  no focal deficits Psych: appropriate affect   Diagnostic Studies 03/2000 Cath  HEMODYNAMIC DATA: The central aortic pressure was 152/95. LV pressure  161/23.No gradient on pullback across the aortic valve.  ANGIOGRAPHIC DATA:  The left main coronary artery was free of critical disease.  The left anterior descending artery courses to the apex. There were three  diagonal branches all of modest size. No significant high-grade stenoses were  noted.  The circumflex provided his large marginal [name] and an A-V circumflex which  bifurcated. The distal circumflex was a relatively small caliber vessel. The  large marginal was free of significant disease.  The right coronary artery is a dominant vessel. It provided a posterior  descending and posterolateral [name]. There was perhaps a very mild tapered  narrowing of 10 to 20% near the junction of the proximal and mid vessel. This  did not appear to be flow limiting and did not appear to be significant.  Ventriculography in the RAO projection reveals preserved global systolic  function. Ejection fraction was 74%. No segmental abnormalities or  contractures were identified.  CONCLUSIONS:  1. Normal left ventricular function.  2. No high-grade coronary artery stenosis.  DISPOSITION: Follow up with Dr. [name] and Dr. [name].  02/2005 Cath  RESULTS:  1. The aortic pressure was 109/66 with a mean of 86.  2. The left ventricular pressure was 109/10.  3. Left main coronary artery: The left main coronary artery was free of  significant disease.  4. Left anterior descending artery: The left anterior descending artery  gave rise to four diagonal branches and three septal perforators. These  and the LAD proper were free of significant disease.  5.  Circumflex artery: The circumflex artery gave rise to a ramus [name],  an atrial [name], a marginal [name] and a posterolateral [name]. These  vessels were free of significant disease.  6. Right coronary artery: The right coronary artery was a large, dominant  vessel that gave rise to dual posterior descending branches and two  posterolateral branches. These vessels were free of significant  disease.  7. Left ventriculogram: The left ventriculogram performed in the RAO  projection showed good wall motion with no areas of hypokinesis. The  inferior wall moved well. The estimated ejection fraction was 55%.  8. Distal aortogram: A distal aortogram was performed that showed patent  renal arteries with no renal obstruction and no significant a aortoiliac  obstruction.  CONCLUSION: Normal coronary angiography and left ventricular wall motion.  RECOMMENDATIONS: Reassurance. Will make arrangements for the patient to  see Dr. Dannielle Burn in follow-up. It does not appear to be cardiac  in etiology,  and Dr. Dannielle Burn can decide about further evaluation.   03/07/13 Clinic EKG  NSR, PACs     Assessment and Plan  1. Palpitatons  - symptoms controlled, we will continue current meds - EKG in clinic shows NSR  2. HTN - at goal, continue current meds - recommend considering ACE-I given his HTN and DM2 if not contraindicated.   3. Hyperlipidemia - request labs from pcp - continue statin   F/u 1 year      Arnoldo Lenis, M.D., F.A.C.C.

## 2015-08-08 NOTE — Patient Instructions (Signed)
Your physician wants you to follow-up in: [place] DR. BRANCH You will receive a reminder letter in the mail two months in advance. If you don't receive a letter, please call our office to schedule the follow-up appointment.  Your physician recommends that you continue on your current medications as directed. Please refer to the Current Medication list given to you today.  Your physician has requested that you regularly monitor and record your blood pressure readings at home FOR 1 WEEK. Please use the same machine at the same time of day to check your readings and record them to bring to your follow-up visit.  Thank you for choosing [facility]!!

## 2015-08-22 ENCOUNTER — Encounter: Payer: Self-pay | Admitting: *Deleted

## 2015-10-28 ENCOUNTER — Ambulatory Visit (INDEPENDENT_AMBULATORY_CARE_PROVIDER_SITE_OTHER): Payer: BC Managed Care – PPO | Admitting: Adult Health

## 2015-10-28 ENCOUNTER — Encounter: Payer: Self-pay | Admitting: Adult Health

## 2015-10-28 VITALS — BP 122/66 | HR 65 | Ht 75.0 in | Wt 257.0 lb

## 2015-10-28 DIAGNOSIS — I10 Essential (primary) hypertension: Secondary | ICD-10-CM | POA: Diagnosis not present

## 2015-10-28 DIAGNOSIS — Z23 Encounter for immunization: Secondary | ICD-10-CM | POA: Diagnosis not present

## 2015-10-28 DIAGNOSIS — I2510 Atherosclerotic heart disease of native coronary artery without angina pectoris: Secondary | ICD-10-CM

## 2015-10-28 DIAGNOSIS — R072 Precordial pain: Secondary | ICD-10-CM | POA: Diagnosis not present

## 2015-10-28 DIAGNOSIS — E7800 Pure hypercholesterolemia, unspecified: Secondary | ICD-10-CM

## 2015-10-28 MED ORDER — NITROGLYCERIN 0.4 MG SL SUBL: 0.4000 mg | SUBLINGUAL_TABLET | SUBLINGUAL | 3 refills | Status: DC | PRN

## 2015-10-28 NOTE — Progress Notes (Signed)
Cardiology Office Note   Date:  10/28/2015   ID:  Johnny, Henson 12/18/1950, MRN [id number]  PCP:  Sherrie Mustache, MD  Cardiologist: Cloria Spring, NP   No chief complaint on file.     History of Present Illness: [name] is a 65 y.o. male who presents for ongoing assessment and management of palpitations. The patient has 7 day event monitor showing normal sinus rhythm, sinus bradycardia with high is in the 50s and occasional PACs correlating with symptoms. He was continued on verapamil and metoprolol XL.   Other history includes hypertension, hyperlipidemia. He is followed by the [place]. They placed him on him Lopressor 12.5 mg twice a day but this did not work as well for him is a long-acting metoprolol and therefore he returned to that medication. He was last seen on 08/08/2015 and was planned for an annual follow-up.  He was seen by primary care physician with ongoing complaints of chest pain. It was described as constant not severe. Follow-up EKG revealed normal sinus rhythm with frequent PVCs, inferior Q waves in lead 3. This was essentially unchanged from EKG in August  2017  He is here with recurrent chest pain which she describes as knifelike located on the left side of his chest occasionally going across to the right side of his chest. It is not associated with diaphoresis or fatigue, but he has been noticing that his breathing status has worsened. His wife who is with him concurs.  Past Medical History:  Diagnosis Date  . Asthma   . Dyslipidemia   . Hypertension   . Sleep apnea     No past surgical history on file.   Current Outpatient Prescriptions  Medication Sig Dispense Refill  . aspirin 81 MG tablet Take 81 mg by mouth daily.    . Cholecalciferol (VITAMIN D3) 2000 UNITS TABS Take 1 tablet by mouth daily.    . hydrochlorothiazide (HYDRODIURIL) 25 MG tablet Take 25 mg by mouth daily.    . meloxicam (MOBIC) 15 MG tablet Take 15 mg by mouth  daily as needed for pain.    . metFORMIN (GLUCOPHAGE) 500 MG tablet Take 500 mg by mouth daily with breakfast.    . metoprolol succinate (TOPROL-XL) 25 MG 24 hr tablet Take 1 tablet (25 mg total) by mouth daily. 90 tablet 3  . mometasone (ASMANEX) 220 MCG/INH inhaler Inhale 2 puffs into the lungs at bedtime.     . pravastatin (PRAVACHOL) 40 MG tablet Take 1 tablet by mouth daily.    . tamsulosin (FLOMAX) 0.4 MG CAPS capsule Take 0.4 mg by mouth daily.    . verapamil (VERELAN PM) 240 MG 24 hr capsule Take 240 mg by mouth at bedtime.    . nitroGLYCERIN (NITROSTAT) 0.4 MG SL tablet Place 1 tablet (0.4 mg total) under the tongue every 5 (five) minutes as needed. 25 tablet 3   No current facility-administered medications for this visit.     Allergies:   Sulfa antibiotics    Social History:  The patient  reports that he quit smoking about 28 years ago. His smoking use included Cigars. He started smoking about 39 years ago. He has never used smokeless tobacco. He reports that he does not drink alcohol or use drugs.   Family History:  The patient's family history includes Diabetes in his father and sister; Heart attack in his brother, father, mother, and paternal grandfather; Heart disease in his brother, father, mother, and paternal grandfather; Hypertension  in his mother; Liver cancer in his sister; Stomach cancer in his mother.    ROS: All other systems are reviewed and negative. Unless otherwise mentioned in H&P    PHYSICAL EXAM: VS:  BP 122/66   Pulse 65   Ht 6\' 3"  (1.905 m)   Wt 257 lb (116.6 kg)   SpO2 95%   BMI 32.12 kg/m  , BMI Body mass index is 32.12 kg/m. GEN: Well nourished, well developed, in no acute distress  HEENT: normal  Neck: no JVD, carotid bruits, or masses Cardiac: RRR; With occasional extrasystole, no murmurs, rubs, or gallops,no edema  Respiratory:  Clear to auscultation bilaterally, normal work of breathing GI: soft, nontender, nondistended, + BS MS: no  deformity or atrophy  Skin: warm and dry, no rash Neuro:  Strength and sensation are intact Psych: euthymic mood, full affect   EKG: Dated 10/16/2015 revealed normal sinus rhythm, frequent premature ventricular contractions in a trigeminal pattern. Questionable Q waves in lead III.  Wt Readings from Last 3 Encounters:  10/28/15 257 lb (116.6 kg)  08/08/15 269 lb (122 kg)  06/26/14 266 lb (120.7 kg)      Other studies Reviewed: Additional studies/ records that were reviewed today include: . Review of the above records demonstrates: Nuclear medicine stress test in 2002, results are not documented although images are available to review.    ASSESSMENT AND PLAN:  1. Chest pain: Cardiovascular risk factors include hypertension, dyslipidemia, diabetes, age, gender. Review records reveal that he has had a stress test back in 2002 but has had no further ischemic testing. Due to recurrence of chest discomfort with cardiovascular risk factors I will plan a Lexiscan Myoview for diagnostic prognostic purposes. Also echocardiogram to evaluate LV function the setting of hypertension.  In the interim he is given a prescription for sublingual nitroglycerin to use when necessary. If he takes them and continues to have recurrent chest pain he may need to be seen in ER if pain worsens.  2. Hypertension: Blood pressures currently well-controlled, continue HCTZ verapamil and metoprolol. Consider low-dose ACE inhibitor in the setting of diabetes. We'll make that decision post stress test and echo.  3. Hypercholesterolemia: Total cholesterol 157, triglycerides 101, HDL 47, LDL 90. He is recommended to begin fish oil tablets along with pravastatin. He is intolerant to other statins which is been prescribed in the past.   Current medicines are reviewed at length with the patient today.    Labs/ tests ordered today include:   Orders Placed This Encounter  Procedures  . NM Myocar Multi W/Spect W/Wall Motion  / EF  . Flu Vaccine QUAD 36+ mos IM  . ECHOCARDIOGRAM COMPLETE     Disposition:   FU with Post procedure to discuss results.  Signed, Jory Sims, NP  10/28/2015 4:24 PM    [facility] [address] [place], [place] [address] Phone: [phone]; Fax: [phone]

## 2015-10-28 NOTE — Progress Notes (Signed)
Name: [name]    DOB: 02-Feb-1950  Age: 65 y.o.  MR#: [id number]       PCP:  Sherrie Mustache, MD      Insurance: Payor: BLUE [place] / Plan: [place] PPO / Product Type: *No Product type* /   CC:   No chief complaint on file.   VS Vitals:   10/28/15 1500  BP: 122/66  Pulse: 65  SpO2: 95%  Weight: 257 lb (116.6 kg)  Height: 6\' 3"  (1.905 m)    Weights Current Weight  10/28/15 257 lb (116.6 kg)  08/08/15 269 lb (122 kg)  06/26/14 266 lb (120.7 kg)    Blood Pressure  BP Readings from Last 3 Encounters:  10/28/15 122/66  08/08/15 138/83  06/26/14 128/77     Admit date:  (Not on file) Last encounter with RMR:  Visit date not found   Allergy Sulfa antibiotics  Current Outpatient Prescriptions  Medication Sig Dispense Refill  . aspirin 81 MG tablet Take 81 mg by mouth daily.    . Cholecalciferol (VITAMIN D3) 2000 UNITS TABS Take 1 tablet by mouth daily.    . hydrochlorothiazide (HYDRODIURIL) 25 MG tablet Take 25 mg by mouth daily.    . meloxicam (MOBIC) 15 MG tablet Take 15 mg by mouth daily as needed for pain.    . metFORMIN (GLUCOPHAGE) 500 MG tablet Take 500 mg by mouth daily with breakfast.    . metoprolol succinate (TOPROL-XL) 25 MG 24 hr tablet Take 1 tablet (25 mg total) by mouth daily. 90 tablet 3  . mometasone (ASMANEX) 220 MCG/INH inhaler Inhale 2 puffs into the lungs at bedtime.     . pravastatin (PRAVACHOL) 40 MG tablet Take 1 tablet by mouth daily.    . tamsulosin (FLOMAX) 0.4 MG CAPS capsule Take 0.4 mg by mouth daily.    . verapamil (VERELAN PM) 240 MG 24 hr capsule Take 240 mg by mouth at bedtime.     No current facility-administered medications for this visit.     Discontinued Meds:   There are no discontinued medications.  Patient Active Problem List   Diagnosis Date Noted  . Screening for cardiovascular condition 03/07/2013  . Palpitations 03/07/2013    LABS No results found for: NA, K, CL, CO2, GLUCOSE, BUN,  CREATININE, CALCIUM, GFRNONAA, GFRAA CMP  No results found for: NA, K, CL, CO2, GLUCOSE, BUN, CREATININE, CALCIUM, PROT, ALBUMIN, AST, ALT, ALKPHOS, BILITOT, GFRNONAA, GFRAA  No results found for: WBC, HGB, HCT, MCV  Lipid Panel  No results found for: CHOL, TRIG, HDL, CHOLHDL, VLDL, LDLCALC, LDLDIRECT  ABG No results found for: PHART, PCO2ART, PO2ART, HCO3, TCO2, ACIDBASEDEF, O2SAT   No results found for: TSH BNP (last 3 results) No results for input(s): BNP in the last 8760 hours.  ProBNP (last 3 results) No results for input(s): PROBNP in the last 8760 hours.  Cardiac Panel (last 3 results) No results for input(s): CKTOTAL, CKMB, TROPONINI, RELINDX in the last 72 hours.  Iron/TIBC/Ferritin/ %Sat No results found for: IRON, TIBC, FERRITIN, IRONPCTSAT   EKG Orders placed or performed in visit on 08/08/15  . EKG 12-Lead     Prior Assessment and Plan Problem List as of 10/28/2015 Reviewed: 08/10/2015 10:32 AM by Carlyle Dolly, MD     Other   Screening for cardiovascular condition   Palpitations       Imaging: No results found.

## 2015-10-28 NOTE — Patient Instructions (Addendum)
Your physician recommends that you schedule a follow-up appointment in: after tests  Your physician has requested that you have an echocardiogram. Echocardiography is a painless test that uses sound waves to create images of your heart. It provides your doctor with information about the size and shape of your heart and how well your heart's chambers and valves are working. This procedure takes approximately one hour. There are no restrictions for this procedure.  Your physician has requested that you have a lexiscan myoview. For further information please visit [email or web address]. Please follow instruction sheet, as given.     Your physician recommends that you continue on your current medications as directed. Please refer to the Current Medication list given to you today.   Nitroglycerin sublingual tablets What is this medicine? NITROGLYCERIN (nye troe GLI ser in) is a type of vasodilator. It relaxes blood vessels, increasing the blood and oxygen supply to your heart. This medicine is used to relieve chest pain caused by angina. It is also used to prevent chest pain before activities like climbing stairs, going outdoors in cold weather, or sexual activity. This medicine may be used for other purposes; ask your health care provider or pharmacist if you have questions. What should I tell my health care provider before I take this medicine? They need to know if you have any of these conditions: -anemia -head injury, recent stroke, or bleeding in the brain -liver disease -previous heart attack -an unusual or allergic reaction to nitroglycerin, other medicines, foods, dyes, or preservatives -pregnant or trying to get pregnant -breast-feeding How should I use this medicine? Take this medicine by mouth as needed. At the first sign of an angina attack (chest pain or tightness) place one tablet under your tongue. You can also take this medicine 5 to 10 minutes before an event likely to produce  chest pain. Follow the directions on the prescription label. Let the tablet dissolve under the tongue. Do not swallow whole. Replace the dose if you accidentally swallow it. It will help if your mouth is not dry. Saliva around the tablet will help it to dissolve more quickly. Do not eat or drink, smoke or chew tobacco while a tablet is dissolving. If you are not better within 5 minutes after taking ONE dose of nitroglycerin, call 9-1-1 immediately to seek emergency medical care. Do not take more than 3 nitroglycerin tablets over 15 minutes. If you take this medicine often to relieve symptoms of angina, your doctor or health care professional may provide you with different instructions to manage your symptoms. If symptoms do not go away after following these instructions, it is important to call 9-1-1 immediately. Do not take more than 3 nitroglycerin tablets over 15 minutes. Talk to your pediatrician regarding the use of this medicine in children. Special care may be needed. Overdosage: If you think you have taken too much of this medicine contact a poison control center or emergency room at once. NOTE: This medicine is only for you. Do not share this medicine with others. What if I miss a dose? This does not apply. This medicine is only used as needed. What may interact with this medicine? Do not take this medicine with any of the following medications: -certain migraine medicines like ergotamine and dihydroergotamine (DHE) -medicines used to treat erectile dysfunction like sildenafil, tadalafil, and vardenafil -riociguat This medicine may also interact with the following medications: -alteplase -aspirin -heparin -medicines for high blood pressure -medicines for mental depression -other medicines used to treat angina -phenothiazines  like chlorpromazine, mesoridazine, prochlorperazine, thioridazine This list may not describe all possible interactions. Give your health care provider a list of all  the medicines, herbs, non-prescription drugs, or dietary supplements you use. Also tell them if you smoke, drink alcohol, or use illegal drugs. Some items may interact with your medicine. What should I watch for while using this medicine? Tell your doctor or health care professional if you feel your medicine is no longer working. Keep this medicine with you at all times. Sit or lie down when you take your medicine to prevent falling if you feel dizzy or faint after using it. Try to remain calm. This will help you to feel better faster. If you feel dizzy, take several deep breaths and lie down with your feet propped up, or bend forward with your head resting between your knees. You may get drowsy or dizzy. Do not drive, use machinery, or do anything that needs mental alertness until you know how this drug affects you. Do not stand or sit up quickly, especially if you are an older patient. This reduces the risk of dizzy or fainting spells. Alcohol can make you more drowsy and dizzy. Avoid alcoholic drinks. Do not treat yourself for coughs, colds, or pain while you are taking this medicine without asking your doctor or health care professional for advice. Some ingredients may increase your blood pressure. What side effects may I notice from receiving this medicine? Side effects that you should report to your doctor or health care professional as soon as possible: -blurred vision -dry mouth -skin rash -sweating -the feeling of extreme pressure in the head -unusually weak or tired Side effects that usually do not require medical attention (report to your doctor or health care professional if they continue or are bothersome): -flushing of the face or neck -headache -irregular heartbeat, palpitations -nausea, vomiting This list may not describe all possible side effects. Call your doctor for medical advice about side effects. You may report side effects to FDA at 1-800-FDA-1088. Where should I keep my  medicine? Keep out of the reach of children. Store at room temperature between 20 and 25 degrees C (68 and 77 degrees F). Store in [employer]. Protect from light and moisture. Keep tightly closed. Throw away any unused medicine after the expiration date. NOTE: This sheet is a summary. It may not cover all possible information. If you have questions about this medicine, talk to your doctor, pharmacist, or health care provider.    2016, Elsevier/Gold Standard. (2012-10-19 17:57:36)    Thank you for choosing [facility] !

## 2015-11-10 ENCOUNTER — Encounter (HOSPITAL_COMMUNITY): Payer: Self-pay

## 2015-11-10 ENCOUNTER — Encounter (HOSPITAL_COMMUNITY)
Admission: RE | Admit: 2015-11-10 | Discharge: 2015-11-10 | Disposition: A | Discharge status: Home / Self Care | Payer: BC Managed Care – PPO | Source: Ambulatory Visit | Attending: Adult Health | Admitting: Adult Health

## 2015-11-10 ENCOUNTER — Inpatient Hospital Stay (HOSPITAL_COMMUNITY): Admission: RE | Admit: 2015-11-10 | Payer: BC Managed Care – PPO | Source: Ambulatory Visit

## 2015-11-10 DIAGNOSIS — I2510 Atherosclerotic heart disease of native coronary artery without angina pectoris: Secondary | ICD-10-CM | POA: Insufficient documentation

## 2015-11-10 DIAGNOSIS — R9439 Abnormal result of other cardiovascular function study: Secondary | ICD-10-CM | POA: Insufficient documentation

## 2015-11-10 DIAGNOSIS — R072 Precordial pain: Secondary | ICD-10-CM

## 2015-11-10 LAB — NM MYOCAR MULTI W/SPECT W/WALL MOTION / EF
CHL CUP NUCLEAR SSS: 10
LV sys vol: 69 mL
LVDIAVOL: 121 mL (ref 62–150)
NUC STRESS TID: 1.02
Peak HR: 79 {beats}/min
RATE: 0.39
Rest HR: 56 {beats}/min
SDS: 5
SRS: 5

## 2015-11-10 MED ORDER — SODIUM CHLORIDE 0.9% FLUSH
INTRAVENOUS | Status: AC
  Administered 2015-11-10: 10 mL via INTRAVENOUS
  Filled 2015-11-10: qty 10

## 2015-11-10 MED ORDER — REGADENOSON 0.4 MG/5ML IV SOLN
INTRAVENOUS | Status: AC
  Administered 2015-11-10: 0.4 mg via INTRAVENOUS
  Filled 2015-11-10: qty 5

## 2015-11-10 MED ORDER — TECHNETIUM TC 99M TETROFOSMIN IV KIT
30.0000 | PACK | Freq: Once | INTRAVENOUS | Status: AC | PRN
  Administered 2015-11-10: 29.4 via INTRAVENOUS

## 2015-11-10 MED ORDER — TECHNETIUM TC 99M TETROFOSMIN IV KIT
10.0000 | PACK | Freq: Once | INTRAVENOUS | Status: AC | PRN
  Administered 2015-11-10: 10 via INTRAVENOUS

## 2015-11-10 NOTE — Progress Notes (Signed)
*  PRELIMINARY RESULTS* Echocardiogram 2D Echocardiogram has been performed.  [name] 11/10/2015, 9:15 AM

## 2015-11-12 ENCOUNTER — Telehealth: Payer: Self-pay | Admitting: *Deleted

## 2015-11-12 NOTE — Telephone Encounter (Signed)
-----   Message from Lendon Colonel, NP sent at 11/12/2015  9:04 AM EST ----- Reviewed. Will discuss on follow up.

## 2015-11-12 NOTE — Telephone Encounter (Signed)
Called patient with test results. No answer. Left message to call back.  

## 2015-11-12 NOTE — Telephone Encounter (Signed)
-----   Message from Lendon Colonel, NP sent at 11/12/2015  8:58 AM EST ----- Will need to be seen on follow up by cardiologist or by me to discuss cardiac cath for evaluation of worsening CAD. Stress test is abnormal.

## 2015-11-14 ENCOUNTER — Ambulatory Visit (HOSPITAL_COMMUNITY)
Admission: RE | Admit: 2015-11-14 | Discharge: 2015-11-14 | Disposition: A | Discharge status: Home / Self Care | Payer: BC Managed Care – PPO | Source: Ambulatory Visit | Attending: Adult Health | Admitting: Adult Health

## 2015-11-14 ENCOUNTER — Encounter: Payer: Self-pay | Admitting: *Deleted

## 2015-11-14 ENCOUNTER — Ambulatory Visit (INDEPENDENT_AMBULATORY_CARE_PROVIDER_SITE_OTHER): Payer: BC Managed Care – PPO | Admitting: Adult Health

## 2015-11-14 ENCOUNTER — Encounter: Payer: Self-pay | Admitting: Adult Health

## 2015-11-14 ENCOUNTER — Other Ambulatory Visit: Payer: Self-pay | Admitting: Adult Health

## 2015-11-14 VITALS — BP 124/76 | HR 75 | Ht 75.0 in | Wt 260.0 lb

## 2015-11-14 DIAGNOSIS — Z01811 Encounter for preprocedural respiratory examination: Secondary | ICD-10-CM | POA: Insufficient documentation

## 2015-11-14 DIAGNOSIS — R9439 Abnormal result of other cardiovascular function study: Secondary | ICD-10-CM | POA: Diagnosis not present

## 2015-11-14 DIAGNOSIS — I200 Unstable angina: Secondary | ICD-10-CM | POA: Diagnosis not present

## 2015-11-14 LAB — ECHOCARDIOGRAM COMPLETE
AVLVOTPG: 5 mmHg
CHL CUP MV DEC (S): 208
CHL CUP RV SYS PRESS: 25 mmHg
CHL CUP STROKE VOLUME: 69 mL
CHL CUP TV REG PEAK VELOCITY: 232 cm/s
E decel time: 208 msec
EERAT: 12.22
FS: 34 % (ref 28–44)
IV/PV OW: 0.99
LA ID, A-P, ES: 49 mm
LA diam index: 1.95 cm/m2
LA vol A4C: 84.6 ml
LA vol index: 34.2 mL/m2
LA vol: 86 mL
LEFT ATRIUM END SYS DIAM: 49 mm
LV E/e'average: 12.22
LV PW d: 11.2 mm — AB (ref 0.6–1.1)
LV SIMPSON'S DISK: 62
LV TDI E'LATERAL: 8.59
LV dias vol: 112 mL (ref 62–150)
LV e' LATERAL: 8.59 cm/s
LV sys vol index: 17 mL/m2
LV sys vol: 43 mL (ref 21–61)
LVDIAVOLIN: 45 mL/m2
LVEEMED: 12.22
LVOT SV: 97 mL
LVOT VTI: 28 cm
LVOT area: 3.46 cm2
LVOT diameter: 21 mm
LVOTPV: 115 cm/s
Lateral S' vel: 12.8 cm/s
MV pk A vel: 61.3 m/s
MV pk E vel: 105 m/s
MVPG: 4 mmHg
TAPSE: 24 mm
TDI e' medial: 6.85
TR max vel: 232 cm/s

## 2015-11-14 NOTE — Patient Instructions (Signed)
Your physician recommends that you schedule a follow-up appointment in: After Catheterization   Your physician recommends that you continue on your current medications as directed. Please refer to the Current Medication list given to you today.  Your physician has requested that you have a cardiac catheterization. Cardiac catheterization is used to diagnose and/or treat various heart conditions. Doctors may recommend this procedure for a number of different reasons. The most common reason is to evaluate chest pain. Chest pain can be a symptom of coronary artery disease (CAD), and cardiac catheterization can show whether plaque is narrowing or blocking your heart's arteries. This procedure is also used to evaluate the valves, as well as measure the blood flow and oxygen levels in different parts of your heart. For further information please visit [email or web address]. Please follow instruction sheet, as given.   If you need a refill on your cardiac medications before your next appointment, please call your pharmacy.  Thank you for choosing [facility]!

## 2015-11-14 NOTE — Progress Notes (Signed)
Name: PADEN HAYDEN    DOB: 01-Feb-1950  Age: 65 y.o.  MR#: [id number]       PCP:  Sherrie Mustache, MD      Insurance: Payor: BLUE [place] / Plan: [place] PPO / Product Type: *No Product type* /   CC:   No chief complaint on file.   VS Vitals:   11/14/15 1319  BP: 124/76  Pulse: 75  SpO2: 94%  Weight: 260 lb (117.9 kg)  Height: 6\' 3"  (1.905 m)    Weights Current Weight  11/14/15 260 lb (117.9 kg)  10/28/15 257 lb (116.6 kg)  08/08/15 269 lb (122 kg)    Blood Pressure  BP Readings from Last 3 Encounters:  11/14/15 124/76  10/28/15 122/66  08/08/15 138/83     Admit date:  (Not on file) Last encounter with RMR:  10/28/2015   Allergy Sulfa antibiotics  Current Outpatient Prescriptions  Medication Sig Dispense Refill  . aspirin 81 MG tablet Take 81 mg by mouth daily.    . Cholecalciferol (VITAMIN D3) 2000 UNITS TABS Take 1 tablet by mouth daily.    . hydrochlorothiazide (HYDRODIURIL) 25 MG tablet Take 25 mg by mouth daily.    . meloxicam (MOBIC) 15 MG tablet Take 15 mg by mouth daily as needed for pain.    . metFORMIN (GLUCOPHAGE) 500 MG tablet Take 500 mg by mouth daily with breakfast.    . metoprolol succinate (TOPROL-XL) 25 MG 24 hr tablet Take 1 tablet (25 mg total) by mouth daily. 90 tablet 3  . mometasone (ASMANEX) 220 MCG/INH inhaler Inhale 2 puffs into the lungs at bedtime.     . nitroGLYCERIN (NITROSTAT) 0.4 MG SL tablet Place 1 tablet (0.4 mg total) under the tongue every 5 (five) minutes as needed. 25 tablet 3  . pravastatin (PRAVACHOL) 40 MG tablet Take 1 tablet by mouth daily.    . tamsulosin (FLOMAX) 0.4 MG CAPS capsule Take 0.4 mg by mouth daily.    . verapamil (VERELAN PM) 240 MG 24 hr capsule Take 240 mg by mouth at bedtime.     No current facility-administered medications for this visit.     Discontinued Meds:   There are no discontinued medications.  Patient Active Problem List   Diagnosis Date Noted  . Screening for  cardiovascular condition 03/07/2013  . Palpitations 03/07/2013    LABS No results found for: NA, K, CL, CO2, GLUCOSE, BUN, CREATININE, CALCIUM, GFRNONAA, GFRAA CMP  No results found for: NA, K, CL, CO2, GLUCOSE, BUN, CREATININE, CALCIUM, PROT, ALBUMIN, AST, ALT, ALKPHOS, BILITOT, GFRNONAA, GFRAA  No results found for: WBC, HGB, HCT, MCV  Lipid Panel  No results found for: CHOL, TRIG, HDL, CHOLHDL, VLDL, LDLCALC, LDLDIRECT  ABG No results found for: PHART, PCO2ART, PO2ART, HCO3, TCO2, ACIDBASEDEF, O2SAT   No results found for: TSH BNP (last 3 results) No results for input(s): BNP in the last 8760 hours.  ProBNP (last 3 results) No results for input(s): PROBNP in the last 8760 hours.  Cardiac Panel (last 3 results) No results for input(s): CKTOTAL, CKMB, TROPONINI, RELINDX in the last 72 hours.  Iron/TIBC/Ferritin/ %Sat No results found for: IRON, TIBC, FERRITIN, IRONPCTSAT   EKG Orders placed or performed in visit on 08/08/15  . EKG 12-Lead     Prior Assessment and Plan Problem List as of 11/14/2015 Reviewed: 10/28/2015  4:32 PM by Jory Sims, NP     Other   Screening for cardiovascular condition   Palpitations  Imaging: Nm Myocar Multi W/spect W/wall Motion / Ef  Result Date: 11/10/2015  Blood pressure demonstrated a normal response to exercise.  Defect 1: There is a medium defect of moderate severity present in the basal inferior, basal inferolateral, mid inferior, mid inferolateral, apical inferior and apex location.  Defect 2: There is a small defect of mild severity present in the apical anterior location. 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  This is an intermediate risk study.  The left ventricular ejection fraction is moderately decreased (30-44%).

## 2015-11-14 NOTE — Progress Notes (Signed)
Cardiology Office Note   Date:  11/14/2015   ID:  Lindley, Hussong 08-23-1950, MRN [id number]  PCP:  Sherrie Mustache, MD  Cardiologist: Cloria Spring, NP   No chief complaint on file.     History of Present Illness: HEVER MISHOE is a 65 y.o. male who presents for ongoing assessment and management of palpitations, hypertension, hyperlipidemia. He is followed by the [place]. He was last seen in the office on 10/28/2015 with complaints of recurrent chest pain which he described as knifelike located on the left side of his chest occasionally going across the right side of his chest. He was planned for [facility] for diagnostic prognostic purposes and an echocardiogram to evaluate LV function in the setting of hypertension. He was given a prescription for sublingual nitroglycerin.  Echocardiogram 11/10/2015 Left ventricle: The cavity size was mildly dilated. Wall   thickness was normal. Features are consistent with a pseudonormal   left ventricular filling pattern, with concomitant abnormal   relaxation and increased filling pressure (grade 2 diastolic   dysfunction). - Left atrium: The atrium was mildly dilated.  Nuclear medicine MPI: Dated 11/10/2015 1.  blood pressure demonstrated a normal response to exercise  2. Defect one: There is a medium defect of moderate severity present in the basal inferior, basal inferior lateral, mid inferior, mid inferior lateral, apical inferior, and apex location. 3. Defect 2: There is a small defect of mild severity present in the apical anterior location. Borderline for ischemia. 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 4. This is an intermediate risk study. The left ventricular ejection fraction is moderately decreased (30-44%).  He comes today with continued complaints of mild chest discomfort and dyspnea.  Past Medical History:  Diagnosis Date  . Asthma   . Dyslipidemia   . Hypertension    . Sleep apnea     History reviewed. No pertinent surgical history.   Current Outpatient Prescriptions  Medication Sig Dispense Refill  . aspirin 81 MG tablet Take 81 mg by mouth daily.    . Cholecalciferol (VITAMIN D3) 2000 UNITS TABS Take 1 tablet by mouth daily.    . hydrochlorothiazide (HYDRODIURIL) 25 MG tablet Take 25 mg by mouth daily.    . meloxicam (MOBIC) 15 MG tablet Take 15 mg by mouth daily as needed for pain.    . metFORMIN (GLUCOPHAGE) 500 MG tablet Take 500 mg by mouth daily with breakfast.    . metoprolol succinate (TOPROL-XL) 25 MG 24 hr tablet Take 1 tablet (25 mg total) by mouth daily. 90 tablet 3  . mometasone (ASMANEX) 220 MCG/INH inhaler Inhale 2 puffs into the lungs at bedtime.     . nitroGLYCERIN (NITROSTAT) 0.4 MG SL tablet Place 1 tablet (0.4 mg total) under the tongue every 5 (five) minutes as needed. 25 tablet 3  . pravastatin (PRAVACHOL) 40 MG tablet Take 1 tablet by mouth daily.    . tamsulosin (FLOMAX) 0.4 MG CAPS capsule Take 0.4 mg by mouth daily.    . verapamil (VERELAN PM) 240 MG 24 hr capsule Take 240 mg by mouth at bedtime.     No current facility-administered medications for this visit.     Allergies:   Sulfa antibiotics    Social History:  The patient  reports that he quit smoking about 28 years ago. His smoking use included Cigars. He started smoking about 39 years ago. He has never used smokeless tobacco. He reports that he does not drink alcohol or  use drugs.   Family History:  The patient's family history includes Diabetes in his father and sister; Heart attack in his brother, father, mother, and paternal grandfather; Heart disease in his brother, father, mother, and paternal grandfather; Hypertension in his mother; Liver cancer in his sister; Stomach cancer in his mother.    ROS: All other systems are reviewed and negative. Unless otherwise mentioned in H&P    PHYSICAL EXAM: VS:  BP 124/76   Pulse 75   Ht 6\' 3"  (1.905 m)   Wt 260 lb  (117.9 kg)   SpO2 94%   BMI 32.50 kg/m  , BMI Body mass index is 32.5 kg/m. GEN: Well nourished, well developed, in no acute distress  HEENT: normal  Neck: no JVD, carotid bruits, or masses Cardiac: RRR; no murmurs, rubs, or gallops,no edema  Respiratory:  clear to auscultation bilaterally, normal work of breathing GI: soft, nontender, nondistended, + BS MS: no deformity or atrophy  Skin: warm and dry, no rash Neuro:  Strength and sensation are intact Psych: euthymic mood, full affect  Recent Labs: No results found for requested labs within last 8760 hours.    Lipid Panel No results found for: CHOL, TRIG, HDL, CHOLHDL, VLDL, LDLCALC, LDLDIRECT    Wt Readings from Last 3 Encounters:  11/14/15 260 lb (117.9 kg)  10/28/15 257 lb (116.6 kg)  08/08/15 269 lb (122 kg)     ASSESSMENT AND PLAN:  1. Abnormal myocardial perfusion study with associated chest pain.: Nuclear medicine stress test revealed evidence of inferior lateral and inferior apical defects. He is symptomatic with worsening dyspnea and chest discomfort. I reviewed these findings with Dr. Harrington Challenger on site. We will plan cardiac catheterization on Tuesday, November 14 at 12 noon with Dr. Daneen Schick III. I have explained the procedure, risks, and benefits, possible intervention if necessary, possible overnight stay if intervention is required. He verbalizes understanding, questions are answered, and he is willing to proceed. He has been advised that he will need to stop his metformin the morning following his cardiac catheterization.  2.  Hypertension: Very well-controlled. No changes in medication regimen at this time. Continue metoprolol HCTZ. [place] labs will be completed.   Current medicines are reviewed at length with the patient today.    Labs/ tests ordered today include: Cardiac catheterization  No orders of the defined types were placed in this encounter.    Disposition:   FU with post  hospitalization/catheterization.  Signed, Jory Sims, NP  11/14/2015 2:04 PM    [facility] [address] [place], [place] [address] Phone: [phone]; Fax: [phone]

## 2015-11-18 ENCOUNTER — Encounter (HOSPITAL_COMMUNITY)
Admission: RE | Disposition: A | Discharge status: Home / Self Care | Payer: Self-pay | Source: Ambulatory Visit | Attending: Interventional Cardiology

## 2015-11-18 ENCOUNTER — Ambulatory Visit (HOSPITAL_COMMUNITY)
Admission: RE | Admit: 2015-11-18 | Discharge: 2015-11-18 | Disposition: A | Discharge status: Home / Self Care | Payer: BC Managed Care – PPO | Source: Ambulatory Visit | Attending: Interventional Cardiology | Admitting: Interventional Cardiology

## 2015-11-18 DIAGNOSIS — R002 Palpitations: Secondary | ICD-10-CM | POA: Diagnosis present

## 2015-11-18 DIAGNOSIS — G4730 Sleep apnea, unspecified: Secondary | ICD-10-CM | POA: Insufficient documentation

## 2015-11-18 DIAGNOSIS — R9439 Abnormal result of other cardiovascular function study: Secondary | ICD-10-CM | POA: Diagnosis not present

## 2015-11-18 DIAGNOSIS — I10 Essential (primary) hypertension: Secondary | ICD-10-CM | POA: Diagnosis not present

## 2015-11-18 DIAGNOSIS — E785 Hyperlipidemia, unspecified: Secondary | ICD-10-CM | POA: Insufficient documentation

## 2015-11-18 DIAGNOSIS — Z7982 Long term (current) use of aspirin: Secondary | ICD-10-CM | POA: Insufficient documentation

## 2015-11-18 DIAGNOSIS — Z87891 Personal history of nicotine dependence: Secondary | ICD-10-CM | POA: Diagnosis not present

## 2015-11-18 DIAGNOSIS — Z7984 Long term (current) use of oral hypoglycemic drugs: Secondary | ICD-10-CM | POA: Diagnosis not present

## 2015-11-18 DIAGNOSIS — R079 Chest pain, unspecified: Secondary | ICD-10-CM

## 2015-11-18 HISTORY — PX: CARDIAC CATHETERIZATION: SHX172

## 2015-11-18 LAB — CBC
HEMATOCRIT: 40.9 % (ref 39.0–52.0)
Hemoglobin: 13.5 g/dL (ref 13.0–17.0)
MCH: 28.4 pg (ref 26.0–34.0)
MCHC: 33 g/dL (ref 30.0–36.0)
MCV: 85.9 fL (ref 78.0–100.0)
Platelets: 228 10*3/uL (ref 150–400)
RBC: 4.76 MIL/uL (ref 4.22–5.81)
RDW: 12.6 % (ref 11.5–15.5)
WBC: 8.7 10*3/uL (ref 4.0–10.5)

## 2015-11-18 LAB — PROTIME-INR
INR: 1.11
PROTHROMBIN TIME: 14.3 s (ref 11.4–15.2)

## 2015-11-18 LAB — BASIC METABOLIC PANEL
Anion gap: 8 (ref 5–15)
BUN: 15 mg/dL (ref 6–20)
CALCIUM: 9.2 mg/dL (ref 8.9–10.3)
CO2: 27 mmol/L (ref 22–32)
Chloride: 102 mmol/L (ref 101–111)
Creatinine, Ser: 1.03 mg/dL (ref 0.61–1.24)
GFR calc Af Amer: >60 mL/min (ref >60)
GLUCOSE: 109 mg/dL — AB (ref 65–99)
Potassium: 3.4 mmol/L — ABNORMAL LOW (ref 3.5–5.1)
Sodium: 137 mmol/L (ref 135–145)

## 2015-11-18 LAB — GLUCOSE, CAPILLARY
GLUCOSE-CAPILLARY: 112 mg/dL — AB (ref 65–99)
Glucose-Capillary: 86 mg/dL (ref 65–99)

## 2015-11-18 SURGERY — LEFT HEART CATH AND CORONARY ANGIOGRAPHY
Anesthesia: LOCAL

## 2015-11-18 MED ORDER — LIDOCAINE HCL (PF) 1 % IJ SOLN
INTRAMUSCULAR | Status: DC | PRN
  Administered 2015-11-18: 2 mL via INTRADERMAL

## 2015-11-18 MED ORDER — HEPARIN (PORCINE) IN NACL 2-0.9 UNIT/ML-% IJ SOLN
INTRAMUSCULAR | Status: DC | PRN
  Administered 2015-11-18: 1000 mL via INTRA_ARTERIAL

## 2015-11-18 MED ORDER — IOPAMIDOL (ISOVUE-370) INJECTION 76%
INTRAVENOUS | Status: AC
Start: 2015-11-18 — End: 2015-11-18
  Filled 2015-11-18: qty 50

## 2015-11-18 MED ORDER — SODIUM CHLORIDE 0.9 % IV SOLN: 250.0000 mL | INTRAVENOUS | Status: DC | PRN

## 2015-11-18 MED ORDER — HEPARIN (PORCINE) IN NACL 2-0.9 UNIT/ML-% IJ SOLN
INTRAMUSCULAR | Status: AC
  Filled 2015-11-18: qty 1000

## 2015-11-18 MED ORDER — MIDAZOLAM HCL 2 MG/2ML IJ SOLN
INTRAMUSCULAR | Status: DC | PRN
  Administered 2015-11-18: 1 mg via INTRAVENOUS

## 2015-11-18 MED ORDER — SODIUM CHLORIDE 0.9% FLUSH: 3.0000 mL | INTRAVENOUS | Status: DC | PRN

## 2015-11-18 MED ORDER — SODIUM CHLORIDE 0.9 % WEIGHT BASED INFUSION: 1.0000 mL/kg/h | INTRAVENOUS | Status: DC

## 2015-11-18 MED ORDER — ACETAMINOPHEN 325 MG PO TABS: 650.0000 mg | ORAL_TABLET | ORAL | Status: DC | PRN

## 2015-11-18 MED ORDER — VERAPAMIL HCL 2.5 MG/ML IV SOLN
INTRAVENOUS | Status: DC | PRN
  Administered 2015-11-18: 14:00:00 via INTRA_ARTERIAL

## 2015-11-18 MED ORDER — HEPARIN SODIUM (PORCINE) 1000 UNIT/ML IJ SOLN
INTRAMUSCULAR | Status: DC | PRN
  Administered 2015-11-18: 5000 [IU] via INTRAVENOUS

## 2015-11-18 MED ORDER — IOPAMIDOL (ISOVUE-370) INJECTION 76%
INTRAVENOUS | Status: DC | PRN
  Administered 2015-11-18: 115 mL via INTRA_ARTERIAL

## 2015-11-18 MED ORDER — FENTANYL CITRATE (PF) 100 MCG/2ML IJ SOLN
INTRAMUSCULAR | Status: AC
  Filled 2015-11-18: qty 2

## 2015-11-18 MED ORDER — HEPARIN SODIUM (PORCINE) 1000 UNIT/ML IJ SOLN
INTRAMUSCULAR | Status: AC
  Filled 2015-11-18: qty 1

## 2015-11-18 MED ORDER — OXYCODONE-ACETAMINOPHEN 5-325 MG PO TABS: 1.0000 | ORAL_TABLET | ORAL | Status: DC | PRN

## 2015-11-18 MED ORDER — POTASSIUM CHLORIDE CRYS ER 20 MEQ PO TBCR
EXTENDED_RELEASE_TABLET | ORAL | Status: AC
  Filled 2015-11-18: qty 1

## 2015-11-18 MED ORDER — SODIUM CHLORIDE 0.9 % WEIGHT BASED INFUSION
3.0000 mL/kg/h | INTRAVENOUS | Status: AC
  Administered 2015-11-18: 3 mL/kg/h via INTRAVENOUS

## 2015-11-18 MED ORDER — SODIUM CHLORIDE 0.9% FLUSH: 3.0000 mL | Freq: Two times a day (BID) | INTRAVENOUS | Status: DC

## 2015-11-18 MED ORDER — VERAPAMIL HCL 2.5 MG/ML IV SOLN
INTRAVENOUS | Status: AC
  Filled 2015-11-18: qty 2

## 2015-11-18 MED ORDER — ASPIRIN 81 MG PO CHEW: 81.0000 mg | CHEWABLE_TABLET | ORAL | Status: DC

## 2015-11-18 MED ORDER — FENTANYL CITRATE (PF) 100 MCG/2ML IJ SOLN
INTRAMUSCULAR | Status: DC | PRN
  Administered 2015-11-18: 50 ug via INTRAVENOUS

## 2015-11-18 MED ORDER — SODIUM CHLORIDE 0.9 % WEIGHT BASED INFUSION: 3.0000 mL/kg/h | INTRAVENOUS | Status: DC

## 2015-11-18 MED ORDER — POTASSIUM CHLORIDE CRYS ER 20 MEQ PO TBCR
40.0000 meq | EXTENDED_RELEASE_TABLET | Freq: Once | ORAL | Status: AC
  Administered 2015-11-18: 40 meq via ORAL

## 2015-11-18 MED ORDER — ONDANSETRON HCL 4 MG/2ML IJ SOLN: 4.0000 mg | Freq: Four times a day (QID) | INTRAMUSCULAR | Status: DC | PRN

## 2015-11-18 MED ORDER — LIDOCAINE HCL (PF) 1 % IJ SOLN
INTRAMUSCULAR | Status: AC
  Filled 2015-11-18: qty 30

## 2015-11-18 MED ORDER — MIDAZOLAM HCL 2 MG/2ML IJ SOLN
INTRAMUSCULAR | Status: AC
  Filled 2015-11-18: qty 2

## 2015-11-18 SURGICAL SUPPLY — 12 items
CATH INFINITI 5 FR JL3.5 (CATHETERS) ×1 IMPLANT
CATH INFINITI 5FR ANG PIGTAIL (CATHETERS) ×1 IMPLANT
CATH INFINITI JR4 5F (CATHETERS) ×1 IMPLANT
DEVICE RAD COMP TR BAND LRG (VASCULAR PRODUCTS) ×1 IMPLANT
GLIDESHEATH SLEND A-KIT 6F 22G (SHEATH) ×1 IMPLANT
GUIDEWIRE INQWIRE 1.5J.035X260 (WIRE) IMPLANT
INQWIRE 1.5J .035X260CM (WIRE) ×2
KIT HEART LEFT (KITS) ×2 IMPLANT
PACK CARDIAC CATHETERIZATION (CUSTOM PROCEDURE TRAY) ×2 IMPLANT
SYR MEDRAD MARK V 150ML (SYRINGE) ×1 IMPLANT
TRANSDUCER W/STOPCOCK (MISCELLANEOUS) ×2 IMPLANT
TUBING CIL FLEX 10 FLL-RA (TUBING) ×2 IMPLANT

## 2015-11-18 NOTE — H&P (View-Only) (Signed)
Cardiology Office Note   Date:  11/14/2015   ID:  Hoyet, Veale 1950/04/04, MRN [id number]  PCP:  Sherrie Mustache, MD  Cardiologist: Cloria Spring, NP   No chief complaint on file.     History of Present Illness: [name] is a 65 y.o. male who presents for ongoing assessment and management of palpitations, hypertension, hyperlipidemia. He is followed by the [place]. He was last seen in the office on 10/28/2015 with complaints of recurrent chest pain which he described as knifelike located on the left side of his chest occasionally going across the right side of his chest. He was planned for [facility] for diagnostic prognostic purposes and an echocardiogram to evaluate LV function in the setting of hypertension. He was given a prescription for sublingual nitroglycerin.  Echocardiogram 11/10/2015 Left ventricle: The cavity size was mildly dilated. Wall   thickness was normal. Features are consistent with a pseudonormal   left ventricular filling pattern, with concomitant abnormal   relaxation and increased filling pressure (grade 2 diastolic   dysfunction). - Left atrium: The atrium was mildly dilated.  Nuclear medicine MPI: Dated 11/10/2015 1.  blood pressure demonstrated a normal response to exercise  2. Defect one: There is a medium defect of moderate severity present in the basal inferior, basal inferior lateral, mid inferior, mid inferior lateral, apical inferior, and apex location. 3. Defect 2: There is a small defect of mild severity present in the apical anterior location. Borderline for ischemia. 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 4. This is an intermediate risk study. The left ventricular ejection fraction is moderately decreased (30-44%).  He comes today with continued complaints of mild chest discomfort and dyspnea.  Past Medical History:  Diagnosis Date  . Asthma   . Dyslipidemia   . Hypertension    . Sleep apnea     History reviewed. No pertinent surgical history.   Current Outpatient Prescriptions  Medication Sig Dispense Refill  . aspirin 81 MG tablet Take 81 mg by mouth daily.    . Cholecalciferol (VITAMIN D3) 2000 UNITS TABS Take 1 tablet by mouth daily.    . hydrochlorothiazide (HYDRODIURIL) 25 MG tablet Take 25 mg by mouth daily.    . meloxicam (MOBIC) 15 MG tablet Take 15 mg by mouth daily as needed for pain.    . metFORMIN (GLUCOPHAGE) 500 MG tablet Take 500 mg by mouth daily with breakfast.    . metoprolol succinate (TOPROL-XL) 25 MG 24 hr tablet Take 1 tablet (25 mg total) by mouth daily. 90 tablet 3  . mometasone (ASMANEX) 220 MCG/INH inhaler Inhale 2 puffs into the lungs at bedtime.     . nitroGLYCERIN (NITROSTAT) 0.4 MG SL tablet Place 1 tablet (0.4 mg total) under the tongue every 5 (five) minutes as needed. 25 tablet 3  . pravastatin (PRAVACHOL) 40 MG tablet Take 1 tablet by mouth daily.    . tamsulosin (FLOMAX) 0.4 MG CAPS capsule Take 0.4 mg by mouth daily.    . verapamil (VERELAN PM) 240 MG 24 hr capsule Take 240 mg by mouth at bedtime.     No current facility-administered medications for this visit.     Allergies:   Sulfa antibiotics    Social History:  The patient  reports that he quit smoking about 28 years ago. His smoking use included Cigars. He started smoking about 39 years ago. He has never used smokeless tobacco. He reports that he does not drink alcohol or  use drugs.   Family History:  The patient's family history includes Diabetes in his father and sister; Heart attack in his brother, father, mother, and paternal grandfather; Heart disease in his brother, father, mother, and paternal grandfather; Hypertension in his mother; Liver cancer in his sister; Stomach cancer in his mother.    ROS: All other systems are reviewed and negative. Unless otherwise mentioned in H&P    PHYSICAL EXAM: VS:  BP 124/76   Pulse 75   Ht 6\' 3"  (1.905 m)   Wt 260 lb  (117.9 kg)   SpO2 94%   BMI 32.50 kg/m  , BMI Body mass index is 32.5 kg/m. GEN: Well nourished, well developed, in no acute distress  HEENT: normal  Neck: no JVD, carotid bruits, or masses Cardiac: RRR; no murmurs, rubs, or gallops,no edema  Respiratory:  clear to auscultation bilaterally, normal work of breathing GI: soft, nontender, nondistended, + BS MS: no deformity or atrophy  Skin: warm and dry, no rash Neuro:  Strength and sensation are intact Psych: euthymic mood, full affect  Recent Labs: No results found for requested labs within last 8760 hours.    Lipid Panel No results found for: CHOL, TRIG, HDL, CHOLHDL, VLDL, LDLCALC, LDLDIRECT    Wt Readings from Last 3 Encounters:  11/14/15 260 lb (117.9 kg)  10/28/15 257 lb (116.6 kg)  08/08/15 269 lb (122 kg)     ASSESSMENT AND PLAN:  1. Abnormal myocardial perfusion study with associated chest pain.: Nuclear medicine stress test revealed evidence of inferior lateral and inferior apical defects. He is symptomatic with worsening dyspnea and chest discomfort. I reviewed these findings with Dr. Harrington Challenger on site. We will plan cardiac catheterization on Tuesday, November 14 at 12 noon with Dr. Daneen Schick III. I have explained the procedure, risks, and benefits, possible intervention if necessary, possible overnight stay if intervention is required. He verbalizes understanding, questions are answered, and he is willing to proceed. He has been advised that he will need to stop his metformin the morning following his cardiac catheterization.  2.  Hypertension: Very well-controlled. No changes in medication regimen at this time. Continue metoprolol HCTZ. [place] labs will be completed.   Current medicines are reviewed at length with the patient today.    Labs/ tests ordered today include: Cardiac catheterization  No orders of the defined types were placed in this encounter.    Disposition:   FU with post  hospitalization/catheterization.  Signed, Jory Sims, NP  11/14/2015 2:04 PM    [facility] [address] [place], [place] [address] Phone: [phone]; Fax: [phone]

## 2015-11-18 NOTE — Discharge Instructions (Signed)
NO METFORMIN/GLUCOPHAGE FOR 2 DAYS ° ° ° ° °Radial Site Care °Introduction °Refer to this sheet in the next few weeks. These instructions provide you with information about caring for yourself after your procedure. Your health care provider may also give you more specific instructions. Your treatment has been planned according to current medical practices, but problems sometimes occur. Call your health care provider if you have any problems or questions after your procedure. °What can I expect after the procedure? °After your procedure, it is typical to have the following: °· Bruising at the radial site that usually fades within 1-2 weeks. °· Blood collecting in the tissue (hematoma) that may be painful to the touch. It should usually decrease in size and tenderness within 1-2 weeks. °Follow these instructions at home: °· Take medicines only as directed by your health care provider. °· You may shower 24-48 hours after the procedure or as directed by your health care provider. Remove the bandage (dressing) and gently wash the site with plain soap and water. Pat the area dry with a clean towel. Do not rub the site, because this may cause bleeding. °· Do not take baths, swim, or use a hot tub until your health care provider approves. °· Check your insertion site every day for redness, swelling, or drainage. °· Do not apply powder or lotion to the site. °· Do not flex or bend the affected arm for 24 hours or as directed by your health care provider. °· Do not push or pull heavy objects with the affected arm for 24 hours or as directed by your health care provider. °· Do not lift over 10 lb (4.5 kg) for 5 days after your procedure or as directed by your health care provider. °· Ask your health care provider when it is okay to: °¨ Return to work or school. °¨ Resume usual physical activities or sports. °¨ Resume sexual activity. °· Do not drive home if you are discharged the same day as the procedure. Have someone else  drive you. °· You may drive 24 hours after the procedure unless otherwise instructed by your health care provider. °· Do not operate machinery or power tools for 24 hours after the procedure. °· If your procedure was done as an outpatient procedure, which means that you went home the same day as your procedure, a responsible adult should be with you for the first 24 hours after you arrive home. °· Keep all follow-up visits as directed by your health care provider. This is important. °Contact a health care provider if: °· You have a fever. °· You have chills. °· You have increased bleeding from the radial site. Hold pressure on the site. °Get help right away if: °· You have unusual pain at the radial site. °· You have redness, warmth, or swelling at the radial site. °· You have drainage (other than a small amount of blood on the dressing) from the radial site. °· The radial site is bleeding, and the bleeding does not stop after 30 minutes of holding steady pressure on the site. °· Your arm or hand becomes pale, cool, tingly, or numb. °This information is not intended to replace advice given to you by your health care provider. Make sure you discuss any questions you have with your health care provider. °Document Released: 01/23/2010 Document Revised: 05/29/2015 Document Reviewed: 07/09/2013 °© 2017 [employer]vier ° °

## 2015-11-18 NOTE — Interval H&P Note (Signed)
Cath Lab Visit (complete for each Cath Lab visit)  Clinical Evaluation Leading to the Procedure:   ACS: Yes.    Non-ACS:    Anginal Classification: CCS III  Anti-ischemic medical therapy: Minimal Therapy (1 class of medications)  Non-Invasive Test Results: Intermediate-risk stress test findings: cardiac mortality 1-3%/year  Prior CABG: No previous CABG      History and Physical Interval Note:  11/18/2015 1:59 PM  [name]  has presented today for surgery, with the diagnosis of abnormal stress test  The various methods of treatment have been discussed with the patient and family. After consideration of risks, benefits and other options for treatment, the patient has consented to  Procedure(s): Left Heart Cath and Coronary Angiography (N/A) as a surgical intervention .  The patient's history has been reviewed, patient examined, no change in status, stable for surgery.  I have reviewed the patient's chart and labs.  Questions were answered to the patient's satisfaction.     Belva Crome III

## 2015-11-19 ENCOUNTER — Encounter (HOSPITAL_COMMUNITY): Payer: Self-pay | Admitting: Interventional Cardiology

## 2015-12-03 ENCOUNTER — Encounter: Payer: Self-pay | Admitting: Physician Assistant

## 2015-12-03 ENCOUNTER — Ambulatory Visit (INDEPENDENT_AMBULATORY_CARE_PROVIDER_SITE_OTHER): Payer: BC Managed Care – PPO | Admitting: Physician Assistant

## 2015-12-03 VITALS — BP 124/72 | HR 57 | Ht 75.0 in | Wt 256.0 lb

## 2015-12-03 DIAGNOSIS — I429 Cardiomyopathy, unspecified: Secondary | ICD-10-CM | POA: Diagnosis not present

## 2015-12-03 DIAGNOSIS — R002 Palpitations: Secondary | ICD-10-CM | POA: Diagnosis not present

## 2015-12-03 DIAGNOSIS — I10 Essential (primary) hypertension: Secondary | ICD-10-CM | POA: Diagnosis not present

## 2015-12-03 DIAGNOSIS — R079 Chest pain, unspecified: Secondary | ICD-10-CM

## 2015-12-03 DIAGNOSIS — E7800 Pure hypercholesterolemia, unspecified: Secondary | ICD-10-CM

## 2015-12-03 NOTE — Progress Notes (Signed)
Cardiology Office Note    Date:  12/03/2015   ID:  [name], DOB 25-Jun-1950, MRN [id number]  PCP:  Sherrie Mustache, MD  Cardiologist: Dr. Harl Bowie   Chief Complaint  Patient presents with  . Follow-up    History of Present Illness:  [name] is a 65 y.o. male history of palpitations secondary to PACs treated with verapamil and Toprol, hypertension and hyperlipidemia.  Patient developed chest pain and had an abnormal nuclear stress test 11/10/15 with a medium defect of moderate severity the basal inferior, basal inferolateral, mid inferior, mid inferior lateral and apex as well as a small defect of mild severity and the apical anterior location borderline for ischemia. EF 30-44%. Patient underwent cardiac catheterization 11/18/15 that showed essentially normal coronary arteries, right dominant system with low normal to mildly depressed LV function 45-50% with normal LVEDP. 2-D echo 11/10/15 showed LVEF 55% with distal inferior hypokinesis with supranormal left ventricular filling pattern and conquer comment abnormal relaxation with grade 2 DD.  Patient comes in today for f/u. He says he's not back to normal yet. Still has fleeting chest pain under his left breast that occurs at anytime and last seconds. No exertional symptoms.No dyspnea or edema.   Past Medical History:  Diagnosis Date  . Asthma   . Dyslipidemia   . Hypertension   . Sleep apnea     Past Surgical History:  Procedure Laterality Date  . CARDIAC CATHETERIZATION N/A 11/18/2015   Procedure: Left Heart Cath and Coronary Angiography;  Surgeon: Belva Crome, MD;  Location: [facility];  Service: Cardiovascular;  Laterality: N/A;    Current Medications: Outpatient Medications Prior to Visit  Medication Sig Dispense Refill  . aspirin 81 MG tablet Take 81 mg by mouth daily.    . Cholecalciferol (VITAMIN D3) 2000 UNITS TABS Take 2,000 Units by mouth daily.     . hydrochlorothiazide (HYDRODIURIL) 25  MG tablet Take 25 mg by mouth daily.    . meloxicam (MOBIC) 15 MG tablet Take 15 mg by mouth daily as needed for pain.    . metFORMIN (GLUCOPHAGE) 500 MG tablet Take 500 mg by mouth daily with breakfast.    . metoprolol succinate (TOPROL-XL) 25 MG 24 hr tablet Take 1 tablet (25 mg total) by mouth daily. 90 tablet 3  . mometasone (ASMANEX) 220 MCG/INH inhaler Inhale 2 puffs into the lungs at bedtime.     . nitroGLYCERIN (NITROSTAT) 0.4 MG SL tablet Place 1 tablet (0.4 mg total) under the tongue every 5 (five) minutes as needed. 25 tablet 3  . pravastatin (PRAVACHOL) 40 MG tablet Take 40 mg by mouth daily.     . tamsulosin (FLOMAX) 0.4 MG CAPS capsule Take 0.4 mg by mouth daily.    . verapamil (VERELAN PM) 240 MG 24 hr capsule Take 240 mg by mouth every morning.      No facility-administered medications prior to visit.      Allergies:   Sulfa antibiotics   Social History   Social History  . Marital status: Married    Spouse name: N/A  . Number of children: N/A  . Years of education: N/A   Social History Main Topics  . Smoking status: Former Smoker    Types: Cigars    Start date: 01/05/1976    Quit date: 01/05/1987  . Smokeless tobacco: Never Used  . Alcohol use No  . Drug use: No  . Sexual activity: Not Asked   Other Topics Concern  .  None   Social History Narrative  . None     Family History:  The patient's family history includes Diabetes in his father and sister; Heart attack in his brother, father, mother, and paternal grandfather; Heart disease in his brother, father, mother, and paternal grandfather; Hypertension in his mother; Liver cancer in his sister; Stomach cancer in his mother.   ROS:   Please see the history of present illness.    Review of Systems  Cardiovascular: Positive for chest pain.   All other systems reviewed and are negative.   PHYSICAL EXAM:   VS:  BP 124/72   Pulse (!) 57   Ht 6\' 3"  (1.905 m)   Wt 256 lb (116.1 kg)   SpO2 96%   BMI 32.00  kg/m   Physical Exam  GEN: Well nourished, well developed, in no acute distress  Neck: no JVD, carotid bruits, or masses Cardiac:RRR; no murmurs, rubs, or gallops  Respiratory:  clear to auscultation bilaterally, normal work of breathing GI: soft, nontender, nondistended, + BS Ext: without cyanosis, clubbing, or edema, Good distal pulses bilaterally MS: no deformity or atrophy  Skin: warm and dry, no rash Psych: euthymic mood, full affect  Wt Readings from Last 3 Encounters:  12/03/15 256 lb (116.1 kg)  11/18/15 260 lb (117.9 kg)  11/14/15 260 lb (117.9 kg)      Studies/Labs Reviewed:   EKG:  EKG is not ordered today.    Recent Labs: 11/18/2015: BUN 15; Creatinine, Ser 1.03; Hemoglobin 13.5; Platelets 228; Potassium 3.4; Sodium 137   Lipid Panel No results found for: CHOL, TRIG, HDL, CHOLHDL, VLDL, LDLCALC, LDLDIRECT  Additional studies/ records that were reviewed today include:  Cardiac catheterization 11/18/15 Conclusion     The left ventricular ejection fraction is 45-50% by visual estimate.  There is mild left ventricular systolic dysfunction.  LV end diastolic pressure is normal.  There is no mitral valve regurgitation.    Essentially normal coronary arteries for age. Right dominant coronary pattern.  Low-normal to mildly depressed LV function estimated to be 45-50%. Normal LVEDP.   RECOMMENDATIONS:    No explanation for the patient's chest discomfort.  LV function is mildly depressed but not into the range suggested by the noninvasive studies.     2-D echo 11/10/15  Study Conclusions   - Left ventricle: The cavity size was mildly dilated. Wall   thickness was normal. Features are consistent with a pseudonormal   left ventricular filling pattern, with concomitant abnormal   relaxation and increased filling pressure (grade 2 diastolic   dysfunction). - Left atrium: The atrium was mildly dilated.    Left ventricle:  LVEF is approximately 55% with  distal inferior hypokineis. The cavity size was mildly dilated. Wall thickness was normal. Features are consistent with a pseudonormal left ventricular filling pattern, with concomitant abnormal relaxation and increased filling pressure (grade 2 diastolic dysfunction).  Nuclear medicine MPI: Dated 11/10/2015 1.  blood pressure demonstrated a normal response to exercise  2. Defect one: There is a medium defect of moderate severity present in the basal inferior, basal inferior lateral, mid inferior, mid inferior lateral, apical inferior, and apex location. 3. Defect 2: There is a small defect of mild severity present in the apical anterior location. Borderline for ischemia. 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 4. This is an intermediate risk study. The left ventricular ejection fraction is moderately decreased (30-44%).      ASSESSMENT:    1. Chest pain in adult  2. Cardiomyopathy of undetermined type (HCC)   3. Palpitations   4. Essential hypertension   5. Hypercholesterolemia      PLAN:  In order of problems listed above:  Chest pain somewhat atypical and fleeting essentially normal coronary arteries on cardiac catheterization. Follow-up with Dr. Harl Bowie in 6 weeks.   Cardiomyopathy ejection fraction 30-44% on nuclear stress test, 45-50% on cardiac catheterization, 55% on 2-D echo. Has been on verapamil for a long time. May consider stopping this and increasing his beta blocker. Will have Dr. Harl Bowie evaluate.  Essential hypertension controlled  Hyperlipidemia on Pravachol      Medication Adjustments/Labs and Tests Ordered: Current medicines are reviewed at length with the patient today.  Concerns regarding medicines are outlined above.  Medication changes, Labs and Tests ordered today are listed in the Patient Instructions below. There are no Patient Instructions on file for this visit.   Signed, Ermalinda Barrios, PA-C    12/03/2015 2:21 PM    [facility] [address] [place], [place], [place]  [address] Phone: [phone]; Fax: [phone]

## 2015-12-03 NOTE — Patient Instructions (Signed)
Your physician recommends that you schedule a follow-up appointment in: 6 Weeks with Dr. Harl Bowie  Your physician recommends that you continue on your current medications as directed. Please refer to the Current Medication list given to you today.  If you need a refill on your cardiac medications before your next appointment, please call your pharmacy.  Thank you for choosing [facility]!

## 2016-01-14 ENCOUNTER — Ambulatory Visit: Payer: BC Managed Care – PPO | Admitting: Cardiology

## 2016-01-16 ENCOUNTER — Ambulatory Visit: Payer: BC Managed Care – PPO | Admitting: Cardiology

## 2016-01-16 NOTE — Progress Notes (Deleted)
Clinical Summary [name] is a 66 y.o.male seen today for follow up of the following medical problems.   1. Palpitations  - 7 day event monitor showed NSR, sinus brady high 50s, and occasional PACs correlating with his symptoms. - he had done well on verapamil and Toprol XL, he states however when he changed his meds over to the [place] his Toprol XL was changed to lopressor 12.5mg  bid and since then symptoms have started again.   - after changing back to Toprol XL he reports symptoms are improved.   2. HTN - does not check at home - compliant with meds  3. Hyperlipidemia - compliant with statin, followed at [facility]  4. Chest pain - abnormal nuclear stress test 11/2015. F/u cath showed no significant CAD - low LVEF by nuclear study but echo with LVEF 55%  Past Medical History:  Diagnosis Date  . Asthma   . Dyslipidemia   . Hypertension   . Sleep apnea      Allergies  Allergen Reactions  . Sulfa Antibiotics Other (See Comments)    Unknown     Current Outpatient Prescriptions  Medication Sig Dispense Refill  . aspirin 81 MG tablet Take 81 mg by mouth daily.    . Cholecalciferol (VITAMIN D3) 2000 UNITS TABS Take 2,000 Units by mouth daily.     . hydrochlorothiazide (HYDRODIURIL) 25 MG tablet Take 25 mg by mouth daily.    . meloxicam (MOBIC) 15 MG tablet Take 15 mg by mouth daily as needed for pain.    . metFORMIN (GLUCOPHAGE) 500 MG tablet Take 500 mg by mouth daily with breakfast.    . metoprolol succinate (TOPROL-XL) 25 MG 24 hr tablet Take 1 tablet (25 mg total) by mouth daily. 90 tablet 3  . mometasone (ASMANEX) 220 MCG/INH inhaler Inhale 2 puffs into the lungs at bedtime.     . nitroGLYCERIN (NITROSTAT) 0.4 MG SL tablet Place 1 tablet (0.4 mg total) under the tongue every 5 (five) minutes as needed. 25 tablet 3  . pravastatin (PRAVACHOL) 40 MG tablet Take 40 mg by mouth daily.     . tamsulosin (FLOMAX) 0.4 MG CAPS capsule Take 0.4 mg by mouth daily.    .  verapamil (VERELAN PM) 240 MG 24 hr capsule Take 240 mg by mouth every morning.      No current facility-administered medications for this visit.      Past Surgical History:  Procedure Laterality Date  . CARDIAC CATHETERIZATION N/A 11/18/2015   Procedure: Left Heart Cath and Coronary Angiography;  Surgeon: Belva Crome, MD;  Location: [facility];  Service: Cardiovascular;  Laterality: N/A;     Allergies  Allergen Reactions  . Sulfa Antibiotics Other (See Comments)    Unknown      Family History  Problem Relation Age of Onset  . Hypertension Mother   . Heart attack Mother   . Stomach cancer Mother   . Heart disease Mother   . Diabetes Father   . Heart attack Father   . Heart disease Father   . Diabetes Sister   . Liver cancer Sister   . Heart attack Brother   . Heart disease Brother   . Heart attack Paternal Grandfather   . Heart disease Paternal Grandfather      Social History [name] reports that he quit smoking about 29 years ago. His smoking use included Cigars. He started smoking about 40 years ago. He has never used smokeless  tobacco. [name] reports that he does not drink alcohol.   Review of Systems CONSTITUTIONAL: No weight loss, fever, chills, weakness or fatigue.  HEENT: Eyes: No visual loss, blurred vision, double vision or yellow sclerae.No hearing loss, sneezing, congestion, runny nose or sore throat.  SKIN: No rash or itching.  CARDIOVASCULAR:  RESPIRATORY: No shortness of breath, cough or sputum.  GASTROINTESTINAL: No anorexia, nausea, vomiting or diarrhea. No abdominal pain or blood.  GENITOURINARY: No burning on urination, no polyuria NEUROLOGICAL: No headache, dizziness, syncope, paralysis, ataxia, numbness or tingling in the extremities. No change in bowel or bladder control.  MUSCULOSKELETAL: No muscle, back pain, joint pain or stiffness.  LYMPHATICS: No enlarged nodes. No history of splenectomy.  PSYCHIATRIC: No history of  depression or anxiety.  ENDOCRINOLOGIC: No reports of sweating, cold or heat intolerance. No polyuria or polydipsia.  [name]   Physical Examination There were no vitals filed for this visit. There were no vitals filed for this visit.  Gen: resting comfortably, no acute distress HEENT: no scleral icterus, pupils equal round and reactive, no palptable cervical adenopathy,  CV Resp: Clear to auscultation bilaterally GI: abdomen is soft, non-tender, non-distended, normal bowel sounds, no hepatosplenomegaly MSK: extremities are warm, no edema.  Skin: warm, no rash Neuro:  no focal deficits Psych: appropriate affect   Diagnostic Studies 03/2000 Cath HEMODYNAMIC DATA: The central aortic pressure was 152/95. LV pressure  161/23.No gradient on pullback across the aortic valve.  ANGIOGRAPHIC DATA:  The left main coronary artery was free of critical disease.  The left anterior descending artery courses to the apex. There were three  diagonal branches all of modest size. No significant high-grade stenoses were  noted.  The circumflex provided his large marginal [name] and an A-V circumflex which  bifurcated. The distal circumflex was a relatively small caliber vessel. The  large marginal was free of significant disease.  The right coronary artery is a dominant vessel. It provided a posterior  descending and posterolateral [name]. There was perhaps a very mild tapered  narrowing of 10 to 20% near the junction of the proximal and mid vessel. This  did not appear to be flow limiting and did not appear to be significant.  Ventriculography in the RAO projection reveals preserved global systolic  function. Ejection fraction was 74%. No segmental abnormalities or  contractures were identified.  CONCLUSIONS:  1. Normal left ventricular function.  2. No high-grade coronary artery stenosis.  DISPOSITION: Follow up with Dr. [name] and Dr. [name].  02/2005 Cath RESULTS:  1.  The aortic pressure was 109/66 with a mean of 86.  2. The left ventricular pressure was 109/10.  3. Left main coronary artery: The left main coronary artery was free of  significant disease.  4. Left anterior descending artery: The left anterior descending artery  gave rise to four diagonal branches and three septal perforators. These  and the LAD proper were free of significant disease.  5. Circumflex artery: The circumflex artery gave rise to a ramus Rahim Astorga,  an atrial Khamron Gellert, a marginal Chike Farrington and a posterolateral Lenora Gomes. These  vessels were free of significant disease.  6. Right coronary artery: The right coronary artery was a large, dominant  vessel that gave rise to dual posterior descending branches and two  posterolateral branches. These vessels were free of significant  disease.  7. Left ventriculogram: The left ventriculogram performed in the RAO  projection showed good wall motion with no areas of hypokinesis. The  inferior wall moved well.  The estimated ejection fraction was 55%.  8. Distal aortogram: A distal aortogram was performed that showed patent  renal arteries with no renal obstruction and no significant a aortoiliac  obstruction.  CONCLUSION: Normal coronary angiography and left ventricular wall motion.  RECOMMENDATIONS: Reassurance. Will make arrangements for the patient to  see Dr. Dannielle Burn in follow-up. It does not appear to be cardiac in etiology,  and Dr. Dannielle Burn can decide about further evaluation.   03/07/13 Clinic EKG NSR, PACs   11/2015 Nuclear stress  Blood pressure demonstrated a normal response to exercise.  Defect 1: There is a medium defect of moderate severity present in the basal inferior, basal inferolateral, mid inferior, mid inferolateral, apical inferior and apex location.  Defect 2: There is a small defect of mild severity present in the apical anterior location. 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  This is an intermediate risk study.  The left ventricular ejection fraction is moderately decreased (30-44%).  11/2015 echo - Left ventricle: The cavity size was mildly dilated. Wall   thickness was normal. Features are consistent with a pseudonormal   left ventricular filling pattern, with concomitant abnormal   relaxation and increased filling pressure (grade 2 diastolic   dysfunction). - Left atrium: The atrium was mildly dilated.  11/2015 cath  The left ventricular ejection fraction is 45-50% by visual estimate.  There is mild left ventricular systolic dysfunction.  LV end diastolic pressure is normal.  There is no mitral valve regurgitation.    Essentially normal coronary arteries for age. Right dominant coronary pattern.  Low-normal to mildly depressed LV function estimated to be 45-50%. Normal LVEDP.  RECOMMENDATIONS:   No explanation for the patient's chest discomfort.  LV function is mildly depressed but not into the range suggested by the noninvasive studies.   Assessment and Plan  1. Palpitatons  - symptoms controlled, we will continue current meds - EKG in clinic shows NSR  2. HTN - at goal, continue current meds - recommend considering ACE-I given his HTN and DM2 if not contraindicated.   3. Hyperlipidemia - request labs from pcp - continue statin   F/u 1 year       Arnoldo Lenis, M.D., F.A.C.C.

## 2016-02-24 ENCOUNTER — Encounter: Payer: Self-pay | Admitting: Cardiology

## 2016-02-24 ENCOUNTER — Ambulatory Visit (INDEPENDENT_AMBULATORY_CARE_PROVIDER_SITE_OTHER): Payer: BC Managed Care – PPO | Admitting: Cardiology

## 2016-02-24 VITALS — BP 128/78 | HR 72 | Ht 75.0 in | Wt 260.0 lb

## 2016-02-24 DIAGNOSIS — E782 Mixed hyperlipidemia: Secondary | ICD-10-CM

## 2016-02-24 DIAGNOSIS — I10 Essential (primary) hypertension: Secondary | ICD-10-CM | POA: Diagnosis not present

## 2016-02-24 DIAGNOSIS — R0789 Other chest pain: Secondary | ICD-10-CM | POA: Diagnosis not present

## 2016-02-24 DIAGNOSIS — R002 Palpitations: Secondary | ICD-10-CM

## 2016-02-24 MED ORDER — METOPROLOL SUCCINATE ER 25 MG PO TB24: 37.5000 mg | ORAL_TABLET | Freq: Every day | ORAL | 3 refills | Status: DC

## 2016-02-24 NOTE — Patient Instructions (Signed)
Medication Instructions:  Start otc ZANTAC 150 MG TWO TIMES DAILY  INCREASE TOPROL XL 37.5 MG DAILY ( 1 1/2 PILLS )   Labwork: I WILL REQUEST LABS FROM [facility]CP/[facility]  Testing/Procedures: NONE  Follow-Up: Your physician recommends that you schedule a follow-up appointment in: 4 MONTHS   Any Other Special Instructions Will Be Listed Below (If Applicable).     If you need a refill on your cardiac medications before your next appointment, please call your pharmacy.

## 2016-02-24 NOTE — Progress Notes (Signed)
Clinical Summary [name] is a 66 y.o.male seen today for follow up of the following medical problems.   1. Palpitations  - 7 day event monitor showed NSR, sinus brady high 50s, and occasional PACs correlating with his symptoms. - he had done well on verapamil and Toprol XL, he states however when he changed his meds over to the [place] his Toprol XL was changed to lopressor 12.5mg  bid and since then symptoms have started again.   - after changing back to Toprol XL he reports symptoms are improved.  - he does report some recent palpitations since last visit.    2. HTN - does not check bp regularly at home - compliant with meds  3. Hyperlipidemia - compliant with statin, labs are followed at [facility]  4. 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 No current ischemia.  - 11/2015 heart cath: without significant CAD - 11/2015 echo LVEF 55%, grade II diastolic dysfunction  - still with chest pains. - aching pain left side, 4/10 in severity. Can occur at any time. No other associated symptoms. Not positional. Lasts few seconds. Occurs 2-3 times a week. No relation to food. Can have +palpitations.     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    Past Medical History:  Diagnosis Date  . Asthma   . Dyslipidemia   . Hypertension   . Sleep apnea      Allergies  Allergen Reactions  . Sulfa Antibiotics Other (See Comments)    Unknown     Current Outpatient Prescriptions  Medication Sig Dispense Refill  . aspirin 81 MG tablet Take 81 mg by mouth daily.    . Cholecalciferol (VITAMIN D3) 2000 UNITS TABS Take 2,000 Units by mouth daily.     . hydrochlorothiazide (HYDRODIURIL) 25 MG tablet Take 25 mg by mouth daily.    . meloxicam (MOBIC) 15 MG tablet Take 15 mg by mouth daily as needed for pain.    . metFORMIN (GLUCOPHAGE) 500 MG tablet Take 500 mg by mouth daily with breakfast.    .  metoprolol succinate (TOPROL-XL) 25 MG 24 hr tablet Take 1 tablet (25 mg total) by mouth daily. 90 tablet 3  . mometasone (ASMANEX) 220 MCG/INH inhaler Inhale 2 puffs into the lungs at bedtime.     . nitroGLYCERIN (NITROSTAT) 0.4 MG SL tablet Place 1 tablet (0.4 mg total) under the tongue every 5 (five) minutes as needed. 25 tablet 3  . pravastatin (PRAVACHOL) 40 MG tablet Take 40 mg by mouth daily.     . tamsulosin (FLOMAX) 0.4 MG CAPS capsule Take 0.4 mg by mouth daily.    . verapamil (VERELAN PM) 240 MG 24 hr capsule Take 240 mg by mouth every morning.      No current facility-administered medications for this visit.      Past Surgical History:  Procedure Laterality Date  . CARDIAC CATHETERIZATION N/A 11/18/2015   Procedure: Left Heart Cath and Coronary Angiography;  Surgeon: Belva Crome, MD;  Location: [facility];  Service: Cardiovascular;  Laterality: N/A;     Allergies  Allergen Reactions  . Sulfa Antibiotics Other (See Comments)    Unknown      Family History  Problem Relation Age of Onset  . Hypertension Mother   . Heart attack Mother   . Stomach cancer Mother   . Heart disease Mother   . Diabetes  Father   . Heart attack Father   . Heart disease Father   . Diabetes Sister   . Liver cancer Sister   . Heart attack Brother   . Heart disease Brother   . Heart attack Paternal Grandfather   . Heart disease Paternal Grandfather      Social History [name] reports that he quit smoking about 29 years ago. His smoking use included Cigars. He started smoking about 40 years ago. He has never used smokeless tobacco. [name] reports that he does not drink alcohol.   Review of Systems CONSTITUTIONAL: No weight loss, fever, chills, weakness or fatigue.  HEENT: Eyes: No visual loss, blurred vision, double vision or yellow sclerae.No hearing loss, sneezing, congestion, runny nose or sore throat.  SKIN: No rash or itching.  CARDIOVASCULAR: per  HPI RESPIRATORY: No shortness of breath, cough or sputum.  GASTROINTESTINAL: No anorexia, nausea, vomiting or diarrhea. No abdominal pain or blood.  GENITOURINARY: No burning on urination, no polyuria NEUROLOGICAL: No headache, dizziness, syncope, paralysis, ataxia, numbness or tingling in the extremities. No change in bowel or bladder control.  MUSCULOSKELETAL: No muscle, back pain, joint pain or stiffness.  LYMPHATICS: No enlarged nodes. No history of splenectomy.  PSYCHIATRIC: No history of depression or anxiety.  ENDOCRINOLOGIC: No reports of sweating, cold or heat intolerance. No polyuria or polydipsia.  [name]   Physical Examination Vitals:   02/24/16 0950  BP: 128/78  Pulse: 72   Vitals:   02/24/16 0950  Weight: 260 lb (117.9 kg)  Height: 6\' 3"  (1.905 m)    Gen: resting comfortably, no acute distress HEENT: no scleral icterus, pupils equal round and reactive, no palptable cervical adenopathy,  CV: RRR, no m/r/g, no jvd Resp: Clear to auscultation bilaterally GI: abdomen is soft, non-tender, non-distended, normal bowel sounds, no hepatosplenomegaly MSK: extremities are warm, no edema.  Skin: warm, no rash Neuro:  no focal deficits Psych: appropriate affect   Diagnostic Studies 03/2000 Cath HEMODYNAMIC DATA: The central aortic pressure was 152/95. LV pressure  161/23.No gradient on pullback across the aortic valve.  ANGIOGRAPHIC DATA:  The left main coronary artery was free of critical disease.  The left anterior descending artery courses to the apex. There were three  diagonal branches all of modest size. No significant high-grade stenoses were  noted.  The circumflex provided his large marginal [name] and an A-V circumflex which  bifurcated. The distal circumflex was a relatively small caliber vessel. The  large marginal was free of significant disease.  The right coronary artery is a dominant vessel. It provided a posterior  descending and posterolateral  [name]. There was perhaps a very mild tapered  narrowing of 10 to 20% near the junction of the proximal and mid vessel. This  did not appear to be flow limiting and did not appear to be significant.  Ventriculography in the RAO projection reveals preserved global systolic  function. Ejection fraction was 74%. No segmental abnormalities or  contractures were identified.  CONCLUSIONS:  1. Normal left ventricular function.  2. No high-grade coronary artery stenosis.  DISPOSITION: Follow up with Dr. [name] and Dr. [name].  02/2005 Cath RESULTS:  1. The aortic pressure was 109/66 with a mean of 86.  2. The left ventricular pressure was 109/10.  3. Left main coronary artery: The left main coronary artery was free of  significant disease.  4. Left anterior descending artery: The left anterior descending artery  gave rise to four diagonal branches and three septal perforators.  These  and the LAD proper were free of significant disease.  5. Circumflex artery: The circumflex artery gave rise to a ramus Ashby Moskal,  an atrial Andrew Soria, a marginal Panayiotis Rain[place] and a posterolateral Koralyn Prestage. These  vessels were free of significant disease.  6. Right coronary artery: The right coronary artery was a large, dominant  vessel that gave rise to dual posterior descending branches and two  posterolateral branches. These vessels were free of significant  disease.  7. Left ventriculogram: The left ventriculogram performed in the RAO  projection showed good wall motion with no areas of hypokinesis. The  inferior wall moved well. The estimated ejection fraction was 55%.  8. Distal aortogram: A distal aortogram was performed that showed patent  renal arteries with no renal obstruction and no significant a aortoiliac  obstruction.  CONCLUSION: Normal coronary angiography and left ventricular wall motion.  RECOMMENDATIONS: Reassurance. Will make arrangements for the patient to  see Dr.  Dannielle Burn in follow-up. It does not appear to be cardiac in etiology,  and Dr. Dannielle Burn can decide about further evaluation.   03/07/13 Clinic EKG NSR, PACs    11/2015 cath  The left ventricular ejection fraction is 45-50% by visual estimate.  There is mild left ventricular systolic dysfunction.  LV end diastolic pressure is normal.  There is no mitral valve regurgitation.    Essentially normal coronary arteries for age. Right dominant coronary pattern.  Low-normal to mildly depressed LV function estimated to be 45-50%. Normal LVEDP.  RECOMMENDATIONS:   No explanation for the patient's chest discomfort.  LV function is mildly depressed but not into the range suggested by the noninvasive studies.  11/2015 echo Left ventricle:  LVEF is approximately 55% with distal inferior hypokineis. The cavity size was mildly dilated. Wall thickness was normal. Features are consistent with a pseudonormal left ventricular filling pattern, with concomitant abnormal relaxation and increased filling pressure (grade 2 diastolic dysfunction).  Assessment and Plan   1. Palpitatons  - recent symptoms, we will increaser Toprol XL to 37.5mg  daily  2. HTN - at goal, continue current meds -I would  recommend considering ACE-I given his HTN and DM2 if not contraindicated. Defer to pcp.   3. Hyperlipidemia - request labs from primary care doctor - continue statin  4. Chest pain - atypical symptoms, negative ischemic testing recently - continue to monitor. Trial of OTC zantac   F/u 4 months     Arnoldo Lenis, M.D.

## 2016-06-23 ENCOUNTER — Ambulatory Visit (INDEPENDENT_AMBULATORY_CARE_PROVIDER_SITE_OTHER): Payer: BC Managed Care – PPO | Admitting: Cardiology

## 2016-06-23 ENCOUNTER — Encounter: Payer: Self-pay | Admitting: Cardiology

## 2016-06-23 VITALS — BP 132/78 | HR 68 | Ht 75.0 in | Wt 265.0 lb

## 2016-06-23 DIAGNOSIS — R002 Palpitations: Secondary | ICD-10-CM | POA: Diagnosis not present

## 2016-06-23 DIAGNOSIS — R0789 Other chest pain: Secondary | ICD-10-CM

## 2016-06-23 DIAGNOSIS — Z136 Encounter for screening for cardiovascular disorders: Secondary | ICD-10-CM

## 2016-06-23 DIAGNOSIS — E782 Mixed hyperlipidemia: Secondary | ICD-10-CM | POA: Diagnosis not present

## 2016-06-23 DIAGNOSIS — I10 Essential (primary) hypertension: Secondary | ICD-10-CM

## 2016-06-23 NOTE — Progress Notes (Signed)
Clinical Summary [name] is a 67 y.o.male seen today for follow up of the following medical problems.   1. Palpitations  - 7 day event monitor showed NSR, sinus brady high 50s, and occasional PACs correlating with his symptoms. - he had done well on verapamil and Toprol XL, he states however when he changed his meds over to the [place] his Toprol XL was changed to lopressor 12.5mg  bid and since then symptoms have started again.   - after changing back to Toprol XL he reports symptoms are improved.  - last visit we increased Toprol XL to 37.5mg  daily - still with occasional palpitations, fairly infrequent . Better with recent increase Toprol XL.     2. HTN - compliant with meds  3. Hyperlipidemia - compliant with statin, labs are followed at [facility]  4. 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 No current ischemia.  - 11/2015 heart cath: without significant CAD - 11/2015 echo LVEF 55%, grade II diastolic dysfunction    - no significant symptoms since last visit. Started on zantac with some improvement.   5. 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    Past Medical History:  Diagnosis Date  . Asthma   . Dyslipidemia   . Hypertension   . Sleep apnea      Allergies  Allergen Reactions  . Sulfa Antibiotics Other (See Comments)    Unknown     Current Outpatient Prescriptions  Medication Sig Dispense Refill  . aspirin 81 MG tablet Take 81 mg by mouth daily.    . Cholecalciferol (VITAMIN D3) 2000 UNITS TABS Take 2,000 Units by mouth daily.     . hydrochlorothiazide (HYDRODIURIL) 25 MG tablet Take 25 mg by mouth daily.    . meloxicam (MOBIC) 15 MG tablet Take 15 mg by mouth daily as needed for pain.    . metFORMIN (GLUCOPHAGE) 500 MG tablet Take 500 mg by mouth daily with breakfast.    . metoprolol  succinate (TOPROL-XL) 25 MG 24 hr tablet Take 1.5 tablets (37.5 mg total) by mouth daily. 135 tablet 3  . mometasone (ASMANEX) 220 MCG/INH inhaler Inhale 2 puffs into the lungs at bedtime.     . nitroGLYCERIN (NITROSTAT) 0.4 MG SL tablet Place 1 tablet (0.4 mg total) under the tongue every 5 (five) minutes as needed. 25 tablet 3  . pravastatin (PRAVACHOL) 40 MG tablet Take 40 mg by mouth daily.     . tamsulosin (FLOMAX) 0.4 MG CAPS capsule Take 0.4 mg by mouth daily.    . verapamil (VERELAN PM) 240 MG 24 hr capsule Take 240 mg by mouth every morning.      No current facility-administered medications for this visit.      Past Surgical History:  Procedure Laterality Date  . CARDIAC CATHETERIZATION N/A 11/18/2015   Procedure: Left Heart Cath and Coronary Angiography;  Surgeon: Belva Crome, MD;  Location: [place] [place] [facility];  Service: Cardiovascular;  Laterality: N/A;     Allergies  Allergen Reactions  . Sulfa Antibiotics Other (See Comments)    Unknown      Family History  Problem Relation Age of Onset  . Hypertension Mother   . Heart attack Mother   . Stomach cancer Mother   . Heart disease Mother   . Diabetes Father   .  Heart attack Father   . Heart disease Father   . Diabetes Sister   . Liver cancer Sister   . Heart attack Brother   . Heart disease Brother   . Heart attack Paternal Grandfather   . Heart disease Paternal Grandfather      Social History [name] reports that he quit smoking about 29 years ago. His smoking use included Cigars. He started smoking about 40 years ago. He has never used smokeless tobacco. [name] reports that he does not drink alcohol.   Review of Systems CONSTITUTIONAL: No weight loss, fever, chills, weakness or fatigue.  HEENT: Eyes: No visual loss, blurred vision, double vision or yellow sclerae.No hearing loss, sneezing, congestion, runny nose or sore throat.  SKIN: No rash or itching.  CARDIOVASCULAR: per hpi RESPIRATORY: No  shortness of breath, cough or sputum.  GASTROINTESTINAL: No anorexia, nausea, vomiting or diarrhea. No abdominal pain or blood.  GENITOURINARY: No burning on urination, no polyuria NEUROLOGICAL: No headache, dizziness, syncope, paralysis, ataxia, numbness or tingling in the extremities. No change in bowel or bladder control.  MUSCULOSKELETAL: No muscle, back pain, joint pain or stiffness.  LYMPHATICS: No enlarged nodes. No history of splenectomy.  PSYCHIATRIC: No history of depression or anxiety.  ENDOCRINOLOGIC: No reports of sweating, cold or heat intolerance. No polyuria or polydipsia.  [name]   Physical Examination Vitals:   06/23/16 0858  BP: 132/78  Pulse: 68   Vitals:   06/23/16 0858  Weight: 265 lb (120.2 kg)  Height: 6\' 3"  (1.905 m)    Gen: resting comfortably, no acute distress HEENT: no scleral icterus, pupils equal round and reactive, no palptable cervical adenopathy,  CV: RRR, no m/r/g, no jvd Resp: Clear to auscultation bilaterally GI: abdomen is soft, non-tender, non-distended, normal bowel sounds, no hepatosplenomegaly MSK: extremities are warm, no edema.  Skin: warm, no rash Neuro:  no focal deficits Psych: appropriate affect   Diagnostic Studies  03/2000 Cath HEMODYNAMIC DATA: The central aortic pressure was 152/95. LV pressure  161/23.No gradient on pullback across the aortic valve.  ANGIOGRAPHIC DATA:  The left main coronary artery was free of critical disease.  The left anterior descending artery courses to the apex. There were three  diagonal branches all of modest size. No significant high-grade stenoses were  noted.  The circumflex provided his large marginal [name] and an A-V circumflex which  bifurcated. The distal circumflex was a relatively small caliber vessel. The  large marginal was free of significant disease.  The right coronary artery is a dominant vessel. It provided a posterior  descending and posterolateral [name]. There was  perhaps a very mild tapered  narrowing of 10 to 20% near the junction of the proximal and mid vessel. This  did not appear to be flow limiting and did not appear to be significant.  Ventriculography in the RAO projection reveals preserved global systolic  function. Ejection fraction was 74%. No segmental abnormalities or  contractures were identified.  CONCLUSIONS:  1. Normal left ventricular function.  2. No high-grade coronary artery stenosis.  DISPOSITION: Follow up with Dr. [name] and Dr. [name].  02/2005 Cath RESULTS:  1. The aortic pressure was 109/66 with a mean of 86.  2. The left ventricular pressure was 109/10.  3. Left main coronary artery: The left main coronary artery was free of  significant disease.  4. Left anterior descending artery: The left anterior descending artery  gave rise to four diagonal branches and three septal perforators. These  and  the LAD proper were free of significant disease.  5. Circumflex artery: The circumflex artery gave rise to a ramus Hannah Strader,  an atrial Rande Dario, a marginal Gwendy Boeder and a posterolateral Kaelei Wheeler. These  vessels were free of significant disease.  6. Right coronary artery: The right coronary artery was a large, dominant  vessel that gave rise to dual posterior descending branches and two  posterolateral branches. These vessels were free of significant  disease.  7. Left ventriculogram: The left ventriculogram performed in the RAO  projection showed good wall motion with no areas of hypokinesis. The  inferior wall moved well. The estimated ejection fraction was 55%.  8. Distal aortogram: A distal aortogram was performed that showed patent  renal arteries with no renal obstruction and no significant a aortoiliac  obstruction.  CONCLUSION: Normal coronary angiography and left ventricular wall motion.  RECOMMENDATIONS: Reassurance. Will make arrangements for the patient to  see Dr. Dannielle Burn in follow-up.  It does not appear to be cardiac in etiology,  and Dr. Dannielle Burn can decide about further evaluation.   03/07/13 Clinic EKG NSR, PACs    11/2015 cath  The left ventricular ejection fraction is 45-50% by visual estimate.  There is mild left ventricular systolic dysfunction.  LV end diastolic pressure is normal.  There is no mitral valve regurgitation.   Essentially normal coronary arteries for age. Right dominant coronary pattern.  Low-normal to mildly depressed LV function estimated to be 45-50%. Normal LVEDP.  RECOMMENDATIONS:   No explanation for the patient's chest discomfort.  LV function is mildly depressed but not into the range suggested by the noninvasive studies.  11/2015 echo Left ventricle: LVEF is approximately 55% with distal inferior hypokineis. The cavity size was mildly dilated. Wall thickness was normal. Features are consistent with a pseudonormal left ventricular filling pattern, with concomitant abnormal relaxation and increased filling pressure (grade 2 diastolic dysfunction).   Assessment and Plan  1. Palpitatons  - doing well, continue current meds  2. HTN - at goal, he will continue current meds   3. Hyperlipidemia - request labs from [place] - continue statin  4. Chest pain - previous atypical symptoms, negative ischemic testing recently - no recent symptoms, continue to monitor.   5. AAA screen - male over 8 with smoking history, obtain AAA [place]    Arnoldo Lenis, M.D.

## 2016-06-23 NOTE — Patient Instructions (Signed)
Medication Instructions:  Your physician recommends that you continue on your current medications as directed. Please refer to the Current Medication list given to you today.   Labwork: I WILL REQUEST A COPY OF LABS FROM PCP.   Testing/Procedures: AAA SCREENING   Follow-Up: Your physician wants you to follow-up in: 1 YEAR .  You will receive a reminder letter in the mail two months in advance. If you don't receive a letter, please call our office to schedule the follow-up appointment.   Any Other Special Instructions Will Be Listed Below (If Applicable).     If you need a refill on your cardiac medications before your next appointment, please call your pharmacy.

## 2016-07-21 ENCOUNTER — Ambulatory Visit (HOSPITAL_COMMUNITY)
Admission: RE | Admit: 2016-07-21 | Discharge: 2016-07-21 | Disposition: A | Discharge status: Home / Self Care | Payer: BC Managed Care – PPO | Source: Ambulatory Visit | Attending: Cardiology | Admitting: Cardiology

## 2016-07-21 DIAGNOSIS — Z136 Encounter for screening for cardiovascular disorders: Secondary | ICD-10-CM | POA: Diagnosis not present

## 2016-09-21 ENCOUNTER — Other Ambulatory Visit (HOSPITAL_COMMUNITY): Payer: Self-pay | Admitting: Adult Health Nurse Practitioner

## 2016-09-21 ENCOUNTER — Ambulatory Visit (HOSPITAL_COMMUNITY)
Admission: RE | Admit: 2016-09-21 | Discharge: 2016-09-21 | Disposition: A | Discharge status: Home / Self Care | Payer: Medicare Other | Source: Ambulatory Visit | Attending: Adult Health Nurse Practitioner | Admitting: Adult Health Nurse Practitioner

## 2016-09-21 DIAGNOSIS — M19011 Primary osteoarthritis, right shoulder: Secondary | ICD-10-CM | POA: Insufficient documentation

## 2016-09-21 DIAGNOSIS — M25511 Pain in right shoulder: Secondary | ICD-10-CM

## 2017-03-24 ENCOUNTER — Telehealth: Payer: Self-pay | Admitting: Cardiology

## 2017-03-24 MED ORDER — METOPROLOL SUCCINATE ER 25 MG PO TB24: 37.5000 mg | ORAL_TABLET | Freq: Every day | ORAL | 3 refills | Status: DC

## 2017-03-24 NOTE — Telephone Encounter (Signed)
Refill sent.

## 2017-03-24 NOTE — Telephone Encounter (Signed)
°*  STAT* If patient is at the pharmacy, call can be transferred to refill team.   1. Which medications need to be refilled? (please list name of each medication and dose if known)  metoprolol succinate (TOPROL-XL) 25 MG 24 hr tablet [[id number]]    2. Which pharmacy/location (including street and city if local pharmacy) is medication to be sent to? [facility]  3. Do they need a 30 day or 90 day supply? 90 day

## 2017-12-11 IMAGING — NM NM MYOCAR MULTI W/SPECT W/WALL MOTION & EF
2 series · 12 of 12 positions shown · non-contrast
Comparison: none

[Series 1: rest · 8.28mm/px · 6 of 64 frames shown]
[frame 6/64]
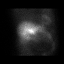
[frame 16/64]
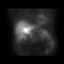
[frame 27/64]
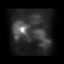
[frame 38/64]
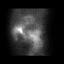
[frame 48/64]
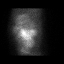
[frame 59/64]
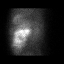

[Series 2: stress gated · 8.28mm/px · 6 of 64 frames shown]
[frame 6/64]
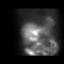
[frame 16/64]
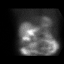
[frame 27/64]
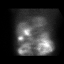
[frame 38/64]
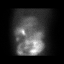
[frame 48/64]
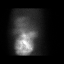
[frame 59/64]
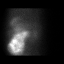

[12 of 12 positions shown; findings below may reference images not displayed]

Canned report from images found in remote index.

Refer to host system for actual result text.

## 2017-12-15 IMAGING — DX DG CHEST 2V
2 series · 2 of 2 positions shown · non-contrast
Comparison: Chest x-ray of 09/20/2003

CLINICAL DATA: Chest pain for 10 days, preop for cardiac
catheterization

EXAM:
CHEST  2 VIEW

[chest pa]
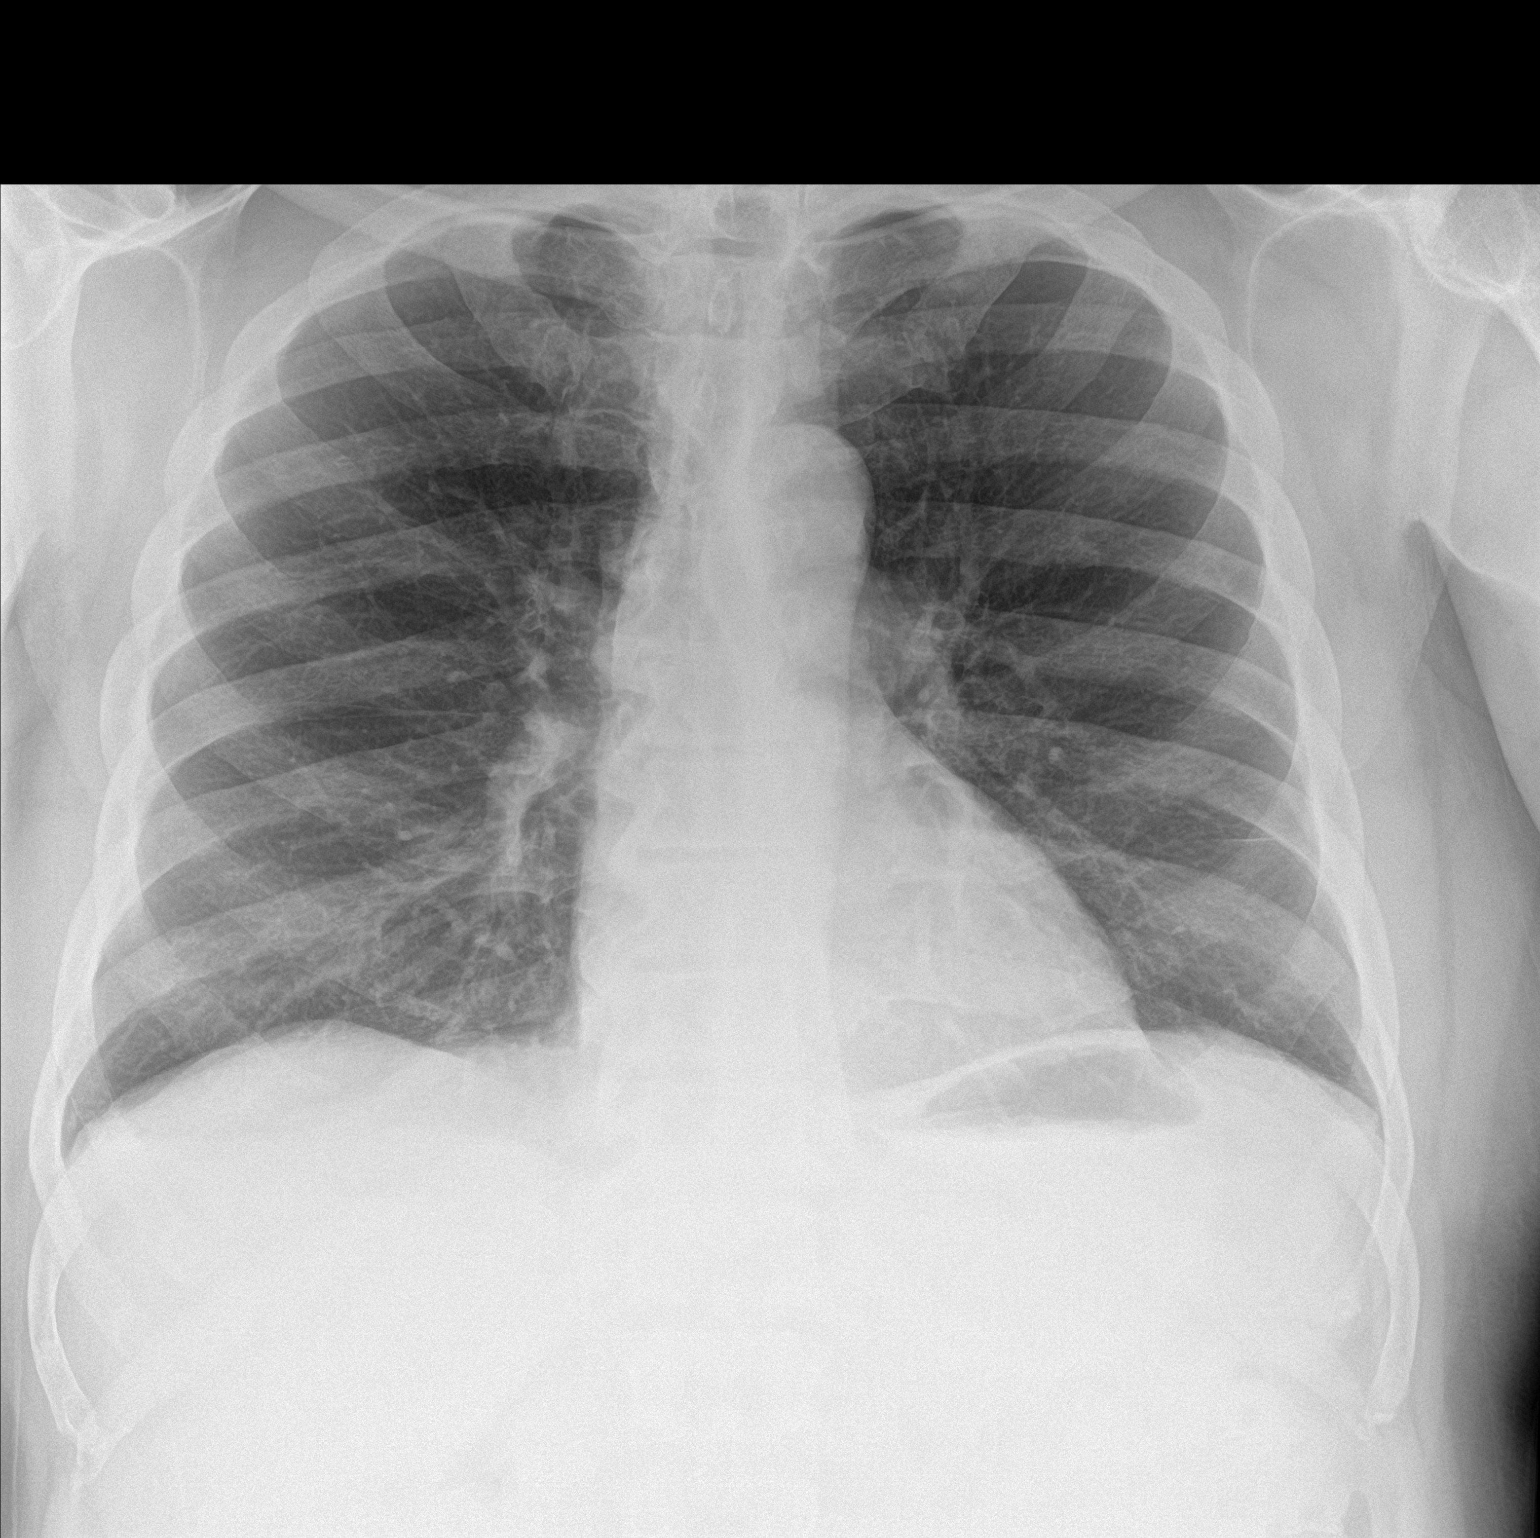

[chest lat]
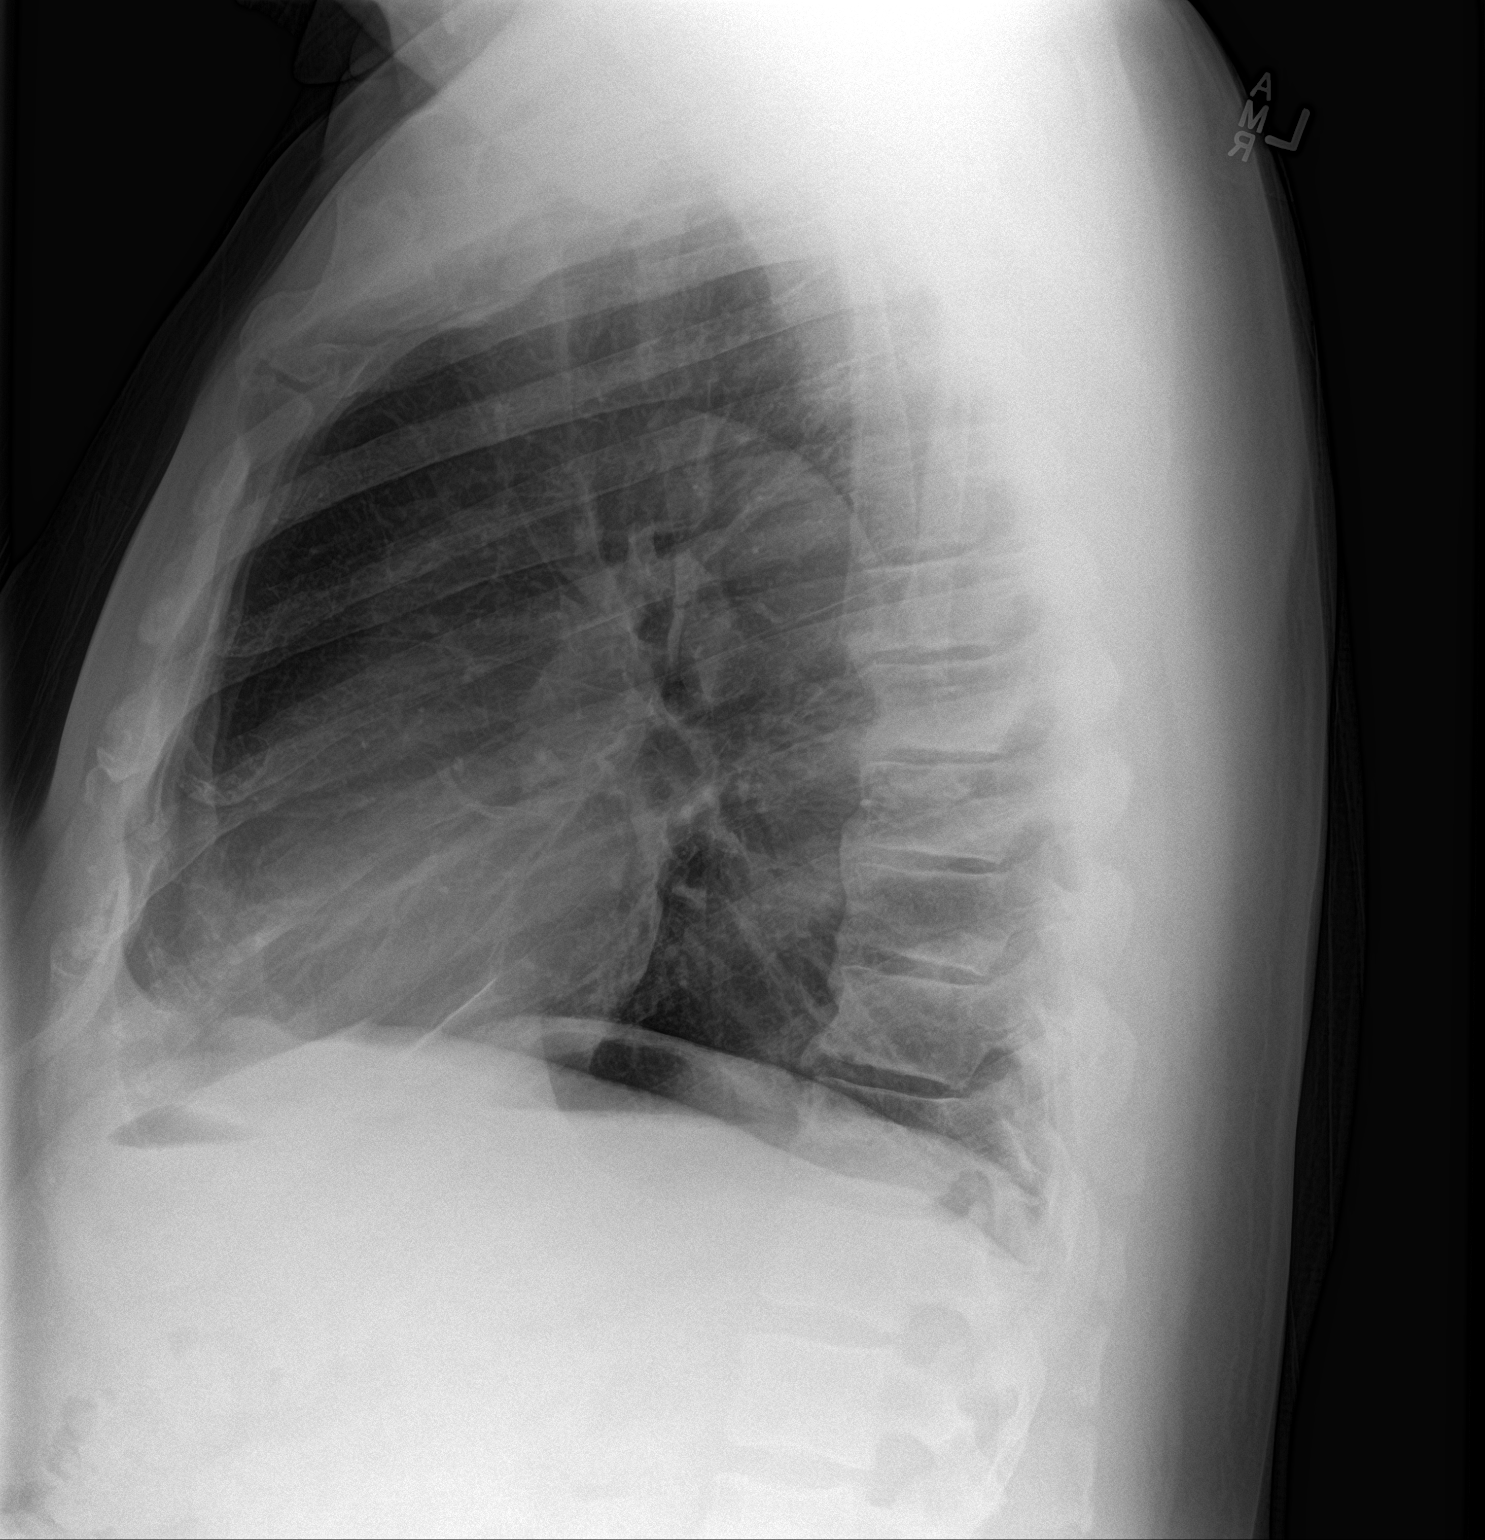

[2 of 2 positions shown; findings below may reference images not displayed]

FINDINGS: No active infiltrate or effusion is seen. Mediastinal and hilar
contours are unremarkable. The heart is within normal limits in
size. No acute bony abnormality is seen.
IMPRESSION: No active cardiopulmonary disease.

## 2018-03-12 ENCOUNTER — Observation Stay (HOSPITAL_COMMUNITY)
Admission: EM | Admit: 2018-03-12 | Discharge: 2018-03-13 | Disposition: A | Discharge status: Home / Self Care | Payer: Medicare Other | Attending: Internal Medicine | Admitting: Internal Medicine

## 2018-03-12 ENCOUNTER — Observation Stay (HOSPITAL_COMMUNITY): Payer: Medicare Other

## 2018-03-12 ENCOUNTER — Other Ambulatory Visit: Payer: Self-pay

## 2018-03-12 ENCOUNTER — Encounter (HOSPITAL_COMMUNITY): Payer: Self-pay | Admitting: Emergency Medicine

## 2018-03-12 ENCOUNTER — Emergency Department (HOSPITAL_COMMUNITY): Payer: Medicare Other

## 2018-03-12 DIAGNOSIS — Z7982 Long term (current) use of aspirin: Secondary | ICD-10-CM | POA: Insufficient documentation

## 2018-03-12 DIAGNOSIS — E119 Type 2 diabetes mellitus without complications: Secondary | ICD-10-CM

## 2018-03-12 DIAGNOSIS — Z79899 Other long term (current) drug therapy: Secondary | ICD-10-CM | POA: Insufficient documentation

## 2018-03-12 DIAGNOSIS — Z87891 Personal history of nicotine dependence: Secondary | ICD-10-CM | POA: Diagnosis not present

## 2018-03-12 DIAGNOSIS — R079 Chest pain, unspecified: Secondary | ICD-10-CM | POA: Diagnosis present

## 2018-03-12 DIAGNOSIS — I110 Hypertensive heart disease with heart failure: Secondary | ICD-10-CM | POA: Diagnosis not present

## 2018-03-12 DIAGNOSIS — I10 Essential (primary) hypertension: Secondary | ICD-10-CM | POA: Diagnosis not present

## 2018-03-12 DIAGNOSIS — R0789 Other chest pain: Secondary | ICD-10-CM | POA: Diagnosis not present

## 2018-03-12 DIAGNOSIS — I5032 Chronic diastolic (congestive) heart failure: Secondary | ICD-10-CM | POA: Insufficient documentation

## 2018-03-12 DIAGNOSIS — R7989 Other specified abnormal findings of blood chemistry: Secondary | ICD-10-CM | POA: Diagnosis not present

## 2018-03-12 DIAGNOSIS — J45909 Unspecified asthma, uncomplicated: Secondary | ICD-10-CM | POA: Insufficient documentation

## 2018-03-12 DIAGNOSIS — R778 Other specified abnormalities of plasma proteins: Secondary | ICD-10-CM | POA: Diagnosis present

## 2018-03-12 DIAGNOSIS — IMO0001 Reserved for inherently not codable concepts without codable children: Secondary | ICD-10-CM

## 2018-03-12 DIAGNOSIS — Z0389 Encounter for observation for other suspected diseases and conditions ruled out: Secondary | ICD-10-CM

## 2018-03-12 HISTORY — DX: Type 2 diabetes mellitus without complications: E11.9

## 2018-03-12 LAB — BASIC METABOLIC PANEL
Anion gap: 8 (ref 5–15)
BUN: 20 mg/dL (ref 8–23)
CO2: 25 mmol/L (ref 22–32)
CREATININE: 0.98 mg/dL (ref 0.61–1.24)
Calcium: 8.8 mg/dL — ABNORMAL LOW (ref 8.9–10.3)
Chloride: 105 mmol/L (ref 98–111)
GFR calc Af Amer: >60 mL/min (ref >60)
GFR calc non Af Amer: >60 mL/min (ref >60)
Glucose, Bld: 115 mg/dL — ABNORMAL HIGH (ref 70–99)
Potassium: 3.6 mmol/L (ref 3.5–5.1)
Sodium: 138 mmol/L (ref 135–145)

## 2018-03-12 LAB — CBC
HCT: 40.1 % (ref 39.0–52.0)
Hemoglobin: 13.1 g/dL (ref 13.0–17.0)
MCH: 28.9 pg (ref 26.0–34.0)
MCHC: 32.7 g/dL (ref 30.0–36.0)
MCV: 88.3 fL (ref 80.0–100.0)
Platelets: 201 10*3/uL (ref 150–400)
RBC: 4.54 MIL/uL (ref 4.22–5.81)
RDW: 12.2 % (ref 11.5–15.5)
WBC: 7.3 10*3/uL (ref 4.0–10.5)
nRBC: 0 % (ref 0.0–0.2)

## 2018-03-12 LAB — MAGNESIUM: Magnesium: 2.1 mg/dL (ref 1.7–2.4)

## 2018-03-12 LAB — TROPONIN I: Troponin I: 0.07 ng/mL (ref <0.03)

## 2018-03-12 MED ORDER — SODIUM CHLORIDE 0.9% FLUSH
3.0000 mL | Freq: Once | INTRAVENOUS | Status: AC
  Administered 2018-03-12: 3 mL via INTRAVENOUS

## 2018-03-12 MED ORDER — INSULIN ASPART 100 UNIT/ML ~~LOC~~ SOLN: 0.0000 [IU] | Freq: Three times a day (TID) | SUBCUTANEOUS | Status: DC

## 2018-03-12 MED ORDER — PRAVASTATIN SODIUM 40 MG PO TABS: 40.0000 mg | ORAL_TABLET | Freq: Every day | ORAL | Status: DC

## 2018-03-12 MED ORDER — ALBUTEROL SULFATE (2.5 MG/3ML) 0.083% IN NEBU: 2.5000 mg | INHALATION_SOLUTION | RESPIRATORY_TRACT | Status: DC | PRN

## 2018-03-12 MED ORDER — MORPHINE SULFATE (PF) 4 MG/ML IV SOLN: 4.0000 mg | INTRAVENOUS | Status: DC | PRN

## 2018-03-12 MED ORDER — ONDANSETRON HCL 4 MG/2ML IJ SOLN: 4.0000 mg | Freq: Four times a day (QID) | INTRAMUSCULAR | Status: DC | PRN

## 2018-03-12 MED ORDER — ASPIRIN 81 MG PO CHEW
324.0000 mg | CHEWABLE_TABLET | Freq: Once | ORAL | Status: AC
  Administered 2018-03-12: 324 mg via ORAL
  Filled 2018-03-12: qty 4

## 2018-03-12 MED ORDER — MOMETASONE FUROATE 220 MCG/INH IN AEPB: 2.0000 | INHALATION_SPRAY | Freq: Every day | RESPIRATORY_TRACT | Status: DC

## 2018-03-12 MED ORDER — METOPROLOL SUCCINATE ER 25 MG PO TB24
37.5000 mg | ORAL_TABLET | Freq: Every day | ORAL | Status: DC
  Administered 2018-03-13: 37.5 mg via ORAL
  Filled 2018-03-12: qty 2

## 2018-03-12 MED ORDER — ASPIRIN EC 81 MG PO TBEC
81.0000 mg | DELAYED_RELEASE_TABLET | Freq: Every day | ORAL | Status: DC
  Administered 2018-03-13: 81 mg via ORAL
  Filled 2018-03-12: qty 1

## 2018-03-12 MED ORDER — ENOXAPARIN SODIUM 40 MG/0.4ML ~~LOC~~ SOLN: 40.0000 mg | SUBCUTANEOUS | Status: DC

## 2018-03-12 MED ORDER — TRAZODONE HCL 50 MG PO TABS: 150.0000 mg | ORAL_TABLET | Freq: Every evening | ORAL | Status: DC | PRN

## 2018-03-12 MED ORDER — ATORVASTATIN CALCIUM 40 MG PO TABS
40.0000 mg | ORAL_TABLET | Freq: Once | ORAL | Status: AC
  Administered 2018-03-13: 40 mg via ORAL
  Filled 2018-03-12: qty 1

## 2018-03-12 MED ORDER — POTASSIUM CHLORIDE CRYS ER 20 MEQ PO TBCR
40.0000 meq | EXTENDED_RELEASE_TABLET | Freq: Once | ORAL | Status: AC
  Administered 2018-03-13: 40 meq via ORAL
  Filled 2018-03-12: qty 2

## 2018-03-12 MED ORDER — ONDANSETRON HCL 4 MG PO TABS: 4.0000 mg | ORAL_TABLET | Freq: Four times a day (QID) | ORAL | Status: DC | PRN

## 2018-03-12 MED ORDER — NITROGLYCERIN 0.4 MG SL SUBL
0.4000 mg | SUBLINGUAL_TABLET | SUBLINGUAL | Status: DC | PRN
  Administered 2018-03-12 (×2): 0.4 mg via SUBLINGUAL
  Filled 2018-03-12: qty 1

## 2018-03-12 NOTE — Progress Notes (Signed)
Brief note regarding plan, with full H&P to follow:   [name] is a 68 y.o. male with medical history significant for chronic diastolic heart failure, type 2 diabetes mellitus, hypertension, hyperlipidemia, asthma, who is admitted to [facility] on 03/12/18 with chest pain.    #) Atypical chest pain: 1 day of non-radiating, substernal chest discomfort that worsens with deep breath, cough, and with certain motions of the upper torso, but is nonexertional in nature.  While the patient does have multiple risk factors for coronary artery disease, including hypertension, hyperlipidemia, and diabetes, he has previously presented twice with similar chest discomfort, pain undergone left-sided heart catheterization on both occasions, most recently in November 2017, revealing no evidence of coronary artery disease. No source for patient's chest discomfort was determined on either of his 2 prior occasions. Therefore, even in the setting of multiple CAD risk factors, it is less likely that the patient would have developed obstructive CAD over the interval 2 years following his most recent coronary angiography. However, given a HEART score of 6 conferring moderate probability of major adverse cardiac event over the ensuing 6 weeks, the patient will admitted for overnight observation and ACS rule-out.  Of note, presenting troponin found to be slightly elevated at 0.07, however, the significance of this is not clear at the present time given no previous troponin values for point of comparison. EKG shows sinus rhythm with ventricular bigeminy, but otherwise no evidence of acute ischemic changes relative to most recent prior EKG from November 2017. Case, including troponin value and EKG was discussed with the on-call cardiologist, Dr. Radford Pax, who recommended overnight observation for ACS rule-out, and did not feel that empiric anticoagulation for mildly elevated troponin in absence of available context was warranted  at the present time.   Given pleuritic element to patient's chest discomfort accompanied by recent nonproductive cough, will pursue CTA of the chest to rule out PE and also to evaluate for atypical pneumonia given presenting chest x-ray demonstrating no evidence of acute cardiopulmonary process.  Additionally, given that certain movements involving the upper torso can re-create patient's chest discomfort, musculoskeletal etiologies are also a possibility.  Of note, the patient received full dose aspirin in the emergency department tonight.   Plan: Atorvastatin 40 mg p.o. x1 now for plaque stabilization qualities.  Continue home beta-blocker.  Trend serial troponin.  Monitor on telemetry.  PRN sublingual nitroglycerin for subsequent chest discomfort.  PRN morphine for chest pain not relieved via nitroglycerin.  Given presenting potassium level 3.6, I have also ordered 40 mEq of oral potassium supplementation in order to maintain serum potassium level of 4.0 or greater to diminish chances of ventricular arrhythmia.  Will add on serum magnesium level labs collected in the ED.  I have also ordered a CTA of the chest, as above.  Should the patient rule out for ACS, can consider need for additional ischemic evaluation.     Babs Bertin, DO Hospitalist

## 2018-03-12 NOTE — H&P (Signed)
History and Physical    PLEASE NOTE THAT DRAGON DICTATION SOFTWARE WAS USED IN THE CONSTRUCTION OF THIS NOTE.   [name] AOZ:[id number] DOB: 01/01/1951 DOA: 03/12/2018  PCP: Dione Housekeeper, MD Patient coming from: home  I have personally briefly reviewed patient's old medical records in [place]  Chief Complaint: chest pain  HPI: [name] is a 68 y.o. male with medical history significant for chronic diastolic heart failure, type 2 diabetes mellitus, hypertension, hyperlipidemia, asthma, who is admitted to [facility] on 03/12/18 with chest pain.   The patient reports acute onset of substernal, non-radiating chest discomfort while at rest at approximately 1400 today.  He reports that discomfort is nonexertional, and states that he has not yet taken any sublingual nitroglycerin to evaluate his response to this medication.  Over the last week, the patient reports a new, nonproductive cough in the absence of any associated shortness of breath, hemoptysis, or subjective fever, chills, rigors, or generalized myalgias.  Denies any recent trauma, travel, or known sick contacts.  He reports that his chest discomfort worsens with deep inspiration and with cough.  Denies any personal or family history of DVT/PE.  Denies any recent leg pain, edema, or erythema.  He also notes reproducibility of his substernal chest discomfort with certain motions of the bilateral upper extremities, but denies any reproducibility of the discomfort with direct palpation of the anterior chest wall.  Reports that the chest pain is not associated with any shortness of breath, nausea, vomiting, palpitations, diaphoresis, dizziness, presyncope, or syncope.  In the setting of similar chest discomfort, the patient reports that he has undergone left-sided heart catheterization in November 2017, which demonstrated no evidence of significant coronary artery disease.  He conveys that no source of his chest pain at  that time was identified, and states that he has undergone no subsequent cardiac ischemic evaluation.  He is currently chest pain-free.    ED Course: Vital signs in the emergency department were notable for the following: Temperature max 98.3; heart rate 56-89; blood pressure 126/64-130 9/78; respiratory rate 15-19, and oxygen saturation 96 to 99% on room air.  Labs performed in the ED were notable for the following: BMP notable for potassium 3.6, bicarbonate 25, creatinine 0.98, and glucose 115.  CBC notable for white blood cell count of 7300.  Troponin I times one 0.07, without any prior troponin data point available for point of comparison.  2 view chest x-ray, per final radiology report showed no evidence of acute cardiopulmonary process, including no evidence of infiltrate, edema, effusion, or pneumothorax.  EKG performed this evening, in comparison to most recent prior EKG from November 2017, showed sinus rhythm with interval development of ventricular bigeminy with ventricular rate of 66, nonspecific intraventricular conduction delay with QRS 162 MS. additionally, compared to EKG from November 2017, this evening's EKG showed evidence of new T wave or ST changes.  The emergency department physician, discussed the patient's case, troponin value, and EKG with the on-call cardiologist, Dr. Radford Pax, who recommended that the patient be admitted to the hospitalist service at [facility] for overnight observation and trending of serial troponin, but did not feel that initiation of full anticoagulation was warranted at this time given lack of evaluatory context in the absence of any prior troponin values.  While still in the emergency department, the patient received full dose aspirin and was subsequently admitted to the med telemetry floor for further evaluation and management of presenting chest discomfort.  Review of Systems: As per HPI otherwise 10 point review of systems negative.   Past  Medical History:  Diagnosis Date  . Asthma   . Diabetes mellitus without complication ([place])   . Dyslipidemia   . Hypertension   . Sleep apnea     Past Surgical History:  Procedure Laterality Date  . CARDIAC CATHETERIZATION N/A 11/18/2015   Procedure: Left Heart Cath and Coronary Angiography;  Surgeon: Belva Crome, MD;  Location: [facility];  Service: Cardiovascular;  Laterality: N/A;    Social History:  reports that he quit smoking about 31 years ago. His smoking use included cigars. He started smoking about 42 years ago. He has never used smokeless tobacco. He reports that he does not drink alcohol or use drugs.   Allergies  Allergen Reactions  . Sulfa Antibiotics Other (See Comments)    Unknown    Family History  Problem Relation Age of Onset  . Hypertension Mother   . Heart attack Mother   . Stomach cancer Mother   . Heart disease Mother   . Diabetes Father   . Heart attack Father   . Heart disease Father   . Diabetes Sister   . Liver cancer Sister   . Heart attack Brother   . Heart disease Brother   . Heart attack Paternal Grandfather   . Heart disease Paternal Grandfather      Prior to Admission medications   Medication Sig Start Date End Date Taking? Authorizing Provider  aspirin 81 MG tablet Take 81 mg by mouth daily.    [provider]  Cholecalciferol (VITAMIN D3) 2000 UNITS TABS Take 2,000 Units by mouth daily.     [provider]  hydrochlorothiazide (HYDRODIURIL) 25 MG tablet Take 25 mg by mouth daily.    [provider]  meloxicam (MOBIC) 15 MG tablet Take 15 mg by mouth daily as needed for pain.    [provider]  metFORMIN (GLUCOPHAGE) 500 MG tablet Take 500 mg by mouth daily with breakfast.    [provider]  metoprolol succinate (TOPROL-XL) 25 MG 24 hr tablet Take 1.5 tablets (37.5 mg total) by mouth daily. 03/24/17   Arnoldo Lenis, MD  mometasone [facility]) 220 MCG/INH inhaler Inhale 2  puffs into the lungs at bedtime.     [provider]  nitroGLYCERIN (NITROSTAT) 0.4 MG SL tablet Place 1 tablet (0.4 mg total) under the tongue every 5 (five) minutes as needed. 10/28/15   Lendon Colonel, NP  pravastatin (PRAVACHOL) 40 MG tablet Take 40 mg by mouth daily.  04/01/14   [provider]  tamsulosin (FLOMAX) 0.4 MG CAPS capsule Take 0.4 mg by mouth daily. 03/28/13   [provider]  verapamil (VERELAN PM) 240 MG 24 hr capsule Take 240 mg by mouth every morning.     [provider]     Objective    Physical Exam: Vitals:   03/12/18 2000 03/12/18 2015 03/12/18 2030 03/12/18 2045  BP: 126/64 129/60 109/69 114/62  Pulse:  89 (!) 101 (!) 46  Resp: 17 15 17 15   Temp:      TempSrc:      SpO2: 97% 95% 96% 98%  Weight:      Height:        General: appears to be stated age; alert, oriented Skin: warm, dry, no rash Head:  AT/[place] Mouth:  Oral mucosa membranes appear moist, normal dentition Neck: supple; trachea midline Heart:  RRR; did not  appreciate any M/R/G Lungs: CTAB, did not appreciate any wheezes, rales, or rhonchi Abdomen: + BS; soft, ND, NT Extremities: no peripheral edema, no muscle wasting   Labs on Admission: I have personally reviewed following labs and imaging studies  CBC: Recent Labs  Lab 03/12/18 1928  WBC 7.3  HGB 13.1  HCT 40.1  MCV 88.3  PLT 073   Basic Metabolic Panel: Recent Labs  Lab 03/12/18 1928  NA 138  K 3.6  CL 105  CO2 25  GLUCOSE 115*  BUN 20  CREATININE 0.98  CALCIUM 8.8*   GFR: Estimated Creatinine Clearance: 101.2 mL/min (by C-G formula based on SCr of 0.98 mg/dL). Liver Function Tests: No results for input(s): AST, ALT, ALKPHOS, BILITOT, PROT, ALBUMIN in the last 168 hours. No results for input(s): LIPASE, AMYLASE in the last 168 hours. No results for input(s): AMMONIA in the last 168 hours. Coagulation Profile: No results for input(s): INR, PROTIME in the last 168  hours. Cardiac Enzymes: Recent Labs  Lab 03/12/18 1928  TROPONINI 0.07*   BNP (last 3 results) No results for input(s): PROBNP in the last 8760 hours. HbA1C: No results for input(s): HGBA1C in the last 72 hours. CBG: No results for input(s): GLUCAP in the last 168 hours. Lipid Profile: No results for input(s): CHOL, HDL, LDLCALC, TRIG, CHOLHDL, LDLDIRECT in the last 72 hours. Thyroid Function Tests: No results for input(s): TSH, T4TOTAL, FREET4, T3FREE, THYROIDAB in the last 72 hours. Anemia Panel: No results for input(s): VITAMINB12, FOLATE, FERRITIN, TIBC, IRON, RETICCTPCT in the last 72 hours. Urine analysis: No results found for: COLORURINE, APPEARANCEUR, LABSPEC, PHURINE, GLUCOSEU, HGBUR, BILIRUBINUR, KETONESUR, PROTEINUR, UROBILINOGEN, NITRITE, LEUKOCYTESUR  Radiological Exams on Admission: Dg Chest 2 View  Result Date: 03/12/2018 CLINICAL DATA:  Chest pain. EXAM: CHEST - 2 VIEW COMPARISON:  November 14, 2015 FINDINGS: The heart size and mediastinal contours are within normal limits. Both lungs are clear. The visualized skeletal structures are unremarkable. IMPRESSION: No active cardiopulmonary disease. Electronically Signed   By: Dorise Bullion III M.D   On: 03/12/2018 18:44     Assessment/Plan   [name] is a 68 y.o. male with medical history significant for chronic diastolic heart failure, type 2 diabetes mellitus, hypertension, hyperlipidemia, asthma, who is admitted to [facility] on 03/12/18 with chest pain.    Principal Problem:   Chest pain Active Problems:   Elevated troponin   Chronic diastolic CHF (congestive heart failure) (HCC)   HTN (hypertension)   Asthma   DM2 (diabetes mellitus, type 2) (HCC)   #) Atypical chest pain: 1 day of non-radiating, substernal chest discomfort that worsens with deep breath, cough, and with certain motions of the upper torso, but is nonexertional in nature.  While the patient does have multiple risk factors for  coronary artery disease, including hypertension, hyperlipidemia, and diabetes, he has previously presented twice with similar chest discomfort, pain undergone left-sided heart catheterization on both occasions, most recently in November 2017, revealing no evidence of coronary artery disease. No source for patient's chest discomfort was determined on either of his 2 prior occasions. Therefore, even in the setting of multiple CAD risk factors, it is less likely that the patient would have developed obstructive CAD over the interval 2 years following his most recent coronary angiography. However, given a HEART score of 6 conferring moderate probability of major adverse cardiac event over the ensuing 6 weeks, the patient will admitted for overnight observation and ACS rule-out.  Of note, presenting troponin  found to be slightly elevated at 0.07, however, the significance of this is not clear at the present time given no previous troponin values for point of comparison. EKG shows sinus rhythm with ventricular bigeminy, but otherwise no evidence of acute ischemic changes relative to most recent prior EKG from November 2017. Case, including troponin value and EKG was discussed with the on-call cardiologist, Dr. Radford Pax, who recommended overnight observation for ACS rule-out, and did not feel that empiric anticoagulation for mildly elevated troponin in absence of available context was warranted at the present time. Of note, patient is currently chest pain-free.   Given pleuritic element to patient's chest discomfort accompanied by recent nonproductive cough, will pursue CTA of the chest to rule out PE and also to evaluate for atypical pneumonia given presenting chest x-ray demonstrating no evidence of acute cardiopulmonary process.  Additionally, given that certain movements involving the upper torso can re-create patient's chest discomfort, musculoskeletal etiologies are also a possibility.  Of note, the patient received  full dose aspirin in the emergency department tonight.   Plan: Atorvastatin 40 mg p.o. x1 now for plaque stabilization qualities.  Continue home beta-blocker.  Trend serial troponin.  Monitor on telemetry.  PRN sublingual nitroglycerin for subsequent chest discomfort.  PRN morphine for chest pain not relieved via nitroglycerin.  Given presenting potassium level 3.6, I have also ordered 40 mEq of oral potassium supplementation in order to maintain serum potassium level of 4.0 or greater to diminish chances of ventricular arrhythmia.  Will add on serum magnesium level labs collected in the ED.  I have also ordered a CTA of the chest, as above.  Should the patient rule out for ACS, can consider need for additional ischemic evaluation.      #) Chronic diastolic heart failure: Most recent echocardiogram in November 2017 showed a mildly dilated left ventricle with normal left ventricular wall thickness, LVEF 55% with distal inferior hypokinesis, and grade 2 diastolic dysfunction.  Patient is on no diuretics as an outpatient.  No evidence of acutely decompensated heart failure at this time.  Plan: Monitor strict I's and O's.     #) Type 2 diabetes mellitus: On metformin as his sole outpatient oral hypoglycemic agent.  Not on exogenous insulin.  Presenting blood sugar per presenting BMP noted to be 115.  Plan: We will hold home metformin during this hospitalization.  Accu-Cheks q. before meals and at bedtime with associated sliding scale insulin.  As the patient is on metformin at home, it is notable that CTA of the chest with contrast has been ordered, in terms of timing of resumption of this medication at time of discharge.     #) Essential hypertension: On HCTZ, Toprol-XL, and verapamil as an outpatient.  Presenting blood pressures noted to be in the systolic 993'T-701'X range.  Plan: We will continue home Toprol-XL, but will hold home doses of HCTZ and verapamil for now.  Monitor via routine vital  signs.     #) Mild persistent asthma: On mometasone as an outpatient.  No evidence of acute exacerbation at this time.  Patient confirms that he completely quit smoking in the 1980s.  Plan: Can continue home mometasone.  PRN albuterol nebulizer.  PRN supplemental oxygen order to maintain oxygen saturations greater than or equal to 92%.    DVT prophylaxis: scd's Code Status: Full code (confirmed per my discussions with the patient this evening).  Family Communication: I discussed the patient's case with his wife, who is present at bedside. Disposition Plan:  Per Rounding Team Consults called: Dr. Radford Pax (on-call cardiology).  Admission status: Observation; med telemetry.   PLEASE NOTE THAT DRAGON DICTATION SOFTWARE WAS USED IN THE CONSTRUCTION OF THIS NOTE.   [facility] Pager [phone] From 3PM- 11PM.   Otherwise, please contact night-coverage [email or web address] Password [facility]  03/12/2018, 9:03 PM

## 2018-03-12 NOTE — ED Provider Notes (Signed)
[facility] Provider Note   CSN: [id number] Arrival date & time: 03/12/18  1723    History   Chief Complaint Chief Complaint  Patient presents with  . Chest Pain    HPI [name] [name] is a 68 y.o. male.     HPI  Pt was seen at 1910. Per pt and his wife, c/o gradual onset and mild improvement of constant mid-sternal chest "pain" that began approximately 1400 today PTA. Pt states he had gotten home from church and was walking around the house when his symptoms began. Pt describes the CP as "pressure." Pt took one of his own SL ntg without improvement. Pt states sitting and resting has improved his CP "some." Pt denies any associated symptoms. Pt's wife states pt had similar CP several years ago and had a cardiac cath performed (no stent). Denies palpitations, no SOB/cough, no abd pain, no N/V/D, no back pain, no injury, no fevers, no rash.    Past Medical History:  Diagnosis Date  . Asthma   . Diabetes mellitus without complication ([place])   . Dyslipidemia   . Hypertension   . Sleep apnea     Patient Active Problem List   Diagnosis Date Noted  . Chest pain 03/12/2018  . Cardiomyopathy of undetermined type ([place]) 12/03/2015  . Abnormal nuclear stress test 11/18/2015  . Chest pain in adult 11/18/2015  . Screening for cardiovascular condition 03/07/2013  . Palpitations 03/07/2013    Past Surgical History:  Procedure Laterality Date  . CARDIAC CATHETERIZATION N/A 11/18/2015   Procedure: Left Heart Cath and Coronary Angiography;  Surgeon: Belva Crome, MD;  Location: [facility];  Service: Cardiovascular;  Laterality: N/A;        Home Medications    Prior to Admission medications   Medication Sig Start Date End Date Taking? Authorizing Provider  aspirin 81 MG tablet Take 81 mg by mouth daily.   Yes [provider]  Cholecalciferol (VITAMIN D3) 2000 UNITS TABS Take 2,000 Units by mouth daily.    Yes [provider]    hydrochlorothiazide (HYDRODIURIL) 25 MG tablet Take 25 mg by mouth daily.   Yes [provider]  meloxicam (MOBIC) 15 MG tablet Take 15 mg by mouth daily as needed for pain.   Yes [provider]  metFORMIN (GLUCOPHAGE) 500 MG tablet Take 500 mg by mouth daily with breakfast.   Yes [provider]  metoprolol succinate (TOPROL-XL) 25 MG 24 hr tablet Take 1.5 tablets (37.5 mg total) by mouth daily. 03/24/17  Yes Johnny Lenis, MD  mometasone [facility]) 220 MCG/INH inhaler Inhale 2 puffs into the lungs at bedtime.    Yes [provider]  nitroGLYCERIN (NITROSTAT) 0.4 MG SL tablet Place 1 tablet (0.4 mg total) under the tongue every 5 (five) minutes as needed. 10/28/15  Yes Lendon Colonel, NP  pravastatin (PRAVACHOL) 40 MG tablet Take 40 mg by mouth daily.  04/01/14  Yes [provider]  tamsulosin (FLOMAX) 0.4 MG CAPS capsule Take 0.4 mg by mouth daily. 03/28/13  Yes [provider]  traZODone (DESYREL) 50 MG tablet Take 3 tablets by mouth at bedtime. 10/01/17  Yes [provider]  verapamil (VERELAN PM) 240 MG 24 hr capsule Take 240 mg by mouth every morning.    Yes [provider]    Family History Family History  Problem Relation Age of Onset  . Hypertension Mother   . Heart attack Mother   . Stomach  cancer Mother   . Heart disease Mother   . Diabetes Father   . Heart attack Father   . Heart disease Father   . Diabetes Sister   . Liver cancer Sister   . Heart attack Brother   . Heart disease Brother   . Heart attack Paternal Grandfather   . Heart disease Paternal Grandfather     Social History Social History   Tobacco Use  . Smoking status: Former Smoker    Types: Cigars    Start date: 01/05/1976    Last attempt to quit: 01/05/1987    Years since quitting: 31.2  . Smokeless tobacco: Never Used  Substance Use Topics  . Alcohol use: No    Alcohol/week: 0.0 standard drinks  . Drug use: No      Allergies   Sulfa antibiotics   Review of Systems Review of Systems ROS: Statement: All systems negative except as marked or noted in the HPI; Constitutional: Negative for fever and chills. ; ; Eyes: Negative for eye pain, redness and discharge. ; ; ENMT: Negative for ear pain, hoarseness, nasal congestion, sinus pressure and sore throat. ; ; Cardiovascular: +CP. Negative for palpitations, diaphoresis, dyspnea and peripheral edema. ; ; Respiratory: Negative for cough, wheezing and stridor. ; ; Gastrointestinal: Negative for nausea, vomiting, diarrhea, abdominal pain, blood in stool, hematemesis, jaundice and rectal bleeding. . ; ; Genitourinary: Negative for dysuria, flank pain and hematuria. ; ; Musculoskeletal: Negative for back pain and neck pain. Negative for swelling and trauma.; ; Skin: Negative for pruritus, rash, abrasions, blisters, bruising and skin lesion.; ; Neuro: Negative for headache, lightheadedness and neck stiffness. Negative for weakness, altered level of consciousness, altered mental status, extremity weakness, paresthesias, involuntary movement, seizure and syncope.       Physical Exam Updated Vital Signs BP 105/64   Pulse (!) 46   Temp 98.3 F (36.8 C) (Oral)   Resp 17   Ht 6\' 3"  (1.905 m)   Wt 124.7 kg   SpO2 99%   BMI 34.37 kg/m    Patient Vitals for the past 24 hrs:  BP Temp Temp src Pulse Resp SpO2 Height Weight  03/12/18 2130 - - - - 17 99 % - -  03/12/18 2115 105/64 - - - (!) 22 97 % - -  03/12/18 2100 111/65 - - - 14 98 % - -  03/12/18 2045 114/62 - - (!) 46 15 98 % - -  03/12/18 2030 109/69 - - (!) 101 17 96 % - -  03/12/18 2015 129/60 - - 89 15 95 % - -  03/12/18 2000 126/64 - - - 17 97 % - -  03/12/18 1945 104/72 - - - 19 96 % - -  03/12/18 1930 136/72 - - (!) 56 17 99 % - -  03/12/18 1745 139/78 98.3 F (36.8 C) Oral 61 16 97 % - -  03/12/18 1742 - - - - - - 6\' 3"  (1.905 m) 124.7 kg     Physical Exam 1915: Physical examination:   Nursing notes reviewed; Vital signs and O2 SAT reviewed;  Constitutional: Well developed, Well nourished, Well hydrated, In no acute distress; Head:  Normocephalic, atraumatic; Eyes: EOMI, PERRL, No scleral icterus; ENMT: Mouth and pharynx normal, Mucous membranes moist; Neck: Supple, Full range of motion, No lymphadenopathy; Cardiovascular: Regular rate and rhythm, No gallop; Respiratory: Breath sounds clear & equal bilaterally, No wheezes.  Speaking full sentences with ease, Normal respiratory effort/excursion; Chest: Nontender, Movement normal; Abdomen:  Soft, Nontender, Nondistended, Normal bowel sounds; Genitourinary: No CVA tenderness; Extremities: Peripheral pulses normal, No tenderness, No edema, No calf edema or asymmetry.; Neuro: AA&Ox3, Major CN grossly intact.  Speech clear. No gross focal motor or sensory deficits in extremities.; Skin: Color normal, Warm, Dry.   ED Treatments / Results  Labs (all labs ordered are listed, but only abnormal results are displayed)   EKG EKG Interpretation  Date/Time:  Sunday March 12 2018 19:57:15 EDT Ventricular Rate:  79 PR Interval:    QRS Duration: 162 QT Interval:  432 QTC Calculation: 402 R Axis:   -21 Text Interpretation:  Sinus rhythm Ventricular bigeminy Nonspecific intraventricular conduction delay Borderline T abnormalities, lateral leads When compared with ECG of 11/18/2015 ventricular bigeminy is now present Otherwise no significant change Confirmed by Francine Graven [phone]) on 03/12/2018 8:15:31 PM   Radiology   Procedures Procedures (including critical care time)  Medications Ordered in ED Medications  nitroGLYCERIN (NITROSTAT) SL tablet 0.4 mg (0.4 mg Sublingual Given 03/12/18 1956)  morphine 4 MG/ML injection 4 mg (has no administration in time range)  sodium chloride flush (NS) 0.9 % injection 3 mL (3 mLs Intravenous Given 03/12/18 1932)  aspirin chewable tablet 324 mg (324 mg Oral Given 03/12/18 1937)     Initial Impression  / Assessment and Plan / ED Course  I have reviewed the triage vital signs and the nursing notes.  Pertinent labs & imaging results that were available during my care of the patient were reviewed by me and considered in my medical decision making (see chart for details).     MDM Reviewed: previous chart, nursing note and vitals Reviewed previous: labs and ECG Interpretation: labs, ECG and x-ray   Results for orders placed or performed during the hospital encounter of 23/55/73  Basic metabolic panel  Result Value Ref Range   Sodium 138 135 - 145 mmol/L   Potassium 3.6 3.5 - 5.1 mmol/L   Chloride 105 98 - 111 mmol/L   CO2 25 22 - 32 mmol/L   Glucose, Bld 115 (H) 70 - 99 mg/dL   BUN 20 8 - 23 mg/dL   Creatinine, Ser 0.98 0.61 - 1.24 mg/dL   Calcium 8.8 (L) 8.9 - 10.3 mg/dL   GFR calc non Af Amer >60 >60 mL/min   GFR calc Af Amer >60 >60 mL/min   Anion gap 8 5 - 15  CBC  Result Value Ref Range   WBC 7.3 4.0 - 10.5 K/uL   RBC 4.54 4.22 - 5.81 MIL/uL   Hemoglobin 13.1 13.0 - 17.0 g/dL   HCT 40.1 39.0 - 52.0 %   MCV 88.3 80.0 - 100.0 fL   MCH 28.9 26.0 - 34.0 pg   MCHC 32.7 30.0 - 36.0 g/dL   RDW 12.2 11.5 - 15.5 %   Platelets 201 150 - 400 K/uL   nRBC 0.0 0.0 - 0.2 %  Troponin I - ONCE - STAT  Result Value Ref Range   Troponin I 0.07 (HH) <0.03 ng/mL   Dg Chest 2 View Result Date: 03/12/2018 CLINICAL DATA:  Chest pain. EXAM: CHEST - 2 VIEW COMPARISON:  November 14, 2015 FINDINGS: The heart size and mediastinal contours are within normal limits. Both lungs are clear. The visualized skeletal structures are unremarkable. IMPRESSION: No active cardiopulmonary disease. Electronically Signed   By: Dorise Bullion III M.D   On: 03/12/2018 18:44    2030:  Pt improved after ASA and SL ntg x2. Troponin mildly elevated, but no  old to compare. EKG with bigeminy, but otherwise unchanged from previous.  Dx and testing d/w pt and family.  Questions answered.  Verb understanding, agreeable to  admit. T/C returned from [facility] Dr. Radford Pax, case discussed, including:  HPI, pertinent PM/SHx, VS/PE, dx testing, ED course and treatment:  Pt can stay at [facility] to cycle troponin overnight for Cards evaluation tomorrow.   2100:  T/C returned from [facility] Dr. Velia Meyer, case discussed, including:  HPI, pertinent PM/SHx, VS/PE, dx testing, ED course and treatment, including d/w Cards MD:  Agreeable to admit.     Final Clinical Impressions(s) / ED Diagnoses   Final diagnoses:  Chest pain in adult    ED Discharge Orders    None       Francine Graven, DO 03/15/18 1704

## 2018-03-12 NOTE — ED Notes (Signed)
Date and time results received: 03/12/18 2009 (use smartphrase ".now" to insert current time)  Test: troponin Critical Value: 0.07  Name of Provider Notified: DR. Thurnell Garbe at 2010  Orders Received? Or Actions Taken?: no/na

## 2018-03-12 NOTE — ED Triage Notes (Signed)
Patient c/o mid-sternal chest pain with tingling in right hand. Patient states pain started at 2pm before eating. Denies any shortness of breath, nausea, vomiting, dizziness, or weakness. Patient has been seen by cardiologist for palpitations and cardiac cath without stent.

## 2018-03-13 ENCOUNTER — Other Ambulatory Visit: Payer: Self-pay | Admitting: Cardiology

## 2018-03-13 ENCOUNTER — Observation Stay (HOSPITAL_COMMUNITY): Payer: Medicare Other

## 2018-03-13 ENCOUNTER — Observation Stay (HOSPITAL_BASED_OUTPATIENT_CLINIC_OR_DEPARTMENT_OTHER): Payer: Medicare Other

## 2018-03-13 ENCOUNTER — Other Ambulatory Visit: Payer: Self-pay

## 2018-03-13 ENCOUNTER — Encounter (HOSPITAL_COMMUNITY): Payer: Self-pay | Admitting: *Deleted

## 2018-03-13 DIAGNOSIS — E119 Type 2 diabetes mellitus without complications: Secondary | ICD-10-CM

## 2018-03-13 DIAGNOSIS — J45909 Unspecified asthma, uncomplicated: Secondary | ICD-10-CM | POA: Diagnosis not present

## 2018-03-13 DIAGNOSIS — R7989 Other specified abnormal findings of blood chemistry: Secondary | ICD-10-CM | POA: Diagnosis not present

## 2018-03-13 DIAGNOSIS — R079 Chest pain, unspecified: Secondary | ICD-10-CM

## 2018-03-13 DIAGNOSIS — I5032 Chronic diastolic (congestive) heart failure: Secondary | ICD-10-CM

## 2018-03-13 DIAGNOSIS — IMO0001 Reserved for inherently not codable concepts without codable children: Secondary | ICD-10-CM

## 2018-03-13 DIAGNOSIS — I10 Essential (primary) hypertension: Secondary | ICD-10-CM

## 2018-03-13 DIAGNOSIS — R778 Other specified abnormalities of plasma proteins: Secondary | ICD-10-CM | POA: Diagnosis present

## 2018-03-13 DIAGNOSIS — I493 Ventricular premature depolarization: Secondary | ICD-10-CM

## 2018-03-13 DIAGNOSIS — R0789 Other chest pain: Secondary | ICD-10-CM | POA: Diagnosis not present

## 2018-03-13 DIAGNOSIS — Z0389 Encounter for observation for other suspected diseases and conditions ruled out: Secondary | ICD-10-CM

## 2018-03-13 LAB — BASIC METABOLIC PANEL
Anion gap: 5 (ref 5–15)
BUN: 20 mg/dL (ref 8–23)
CHLORIDE: 107 mmol/L (ref 98–111)
CO2: 26 mmol/L (ref 22–32)
Calcium: 8.9 mg/dL (ref 8.9–10.3)
Creatinine, Ser: 0.87 mg/dL (ref 0.61–1.24)
GFR calc Af Amer: >60 mL/min (ref >60)
GFR calc non Af Amer: >60 mL/min (ref >60)
Glucose, Bld: 123 mg/dL — ABNORMAL HIGH (ref 70–99)
Potassium: 3.8 mmol/L (ref 3.5–5.1)
SODIUM: 138 mmol/L (ref 135–145)

## 2018-03-13 LAB — TROPONIN I
Troponin I: 0.07 ng/mL (ref <0.03)
Troponin I: 0.07 ng/mL (ref <0.03)

## 2018-03-13 LAB — CBC
HCT: 38 % — ABNORMAL LOW (ref 39.0–52.0)
Hemoglobin: 11.8 g/dL — ABNORMAL LOW (ref 13.0–17.0)
MCH: 28.6 pg (ref 26.0–34.0)
MCHC: 31.1 g/dL (ref 30.0–36.0)
MCV: 92.2 fL (ref 80.0–100.0)
Platelets: 168 10*3/uL (ref 150–400)
RBC: 4.12 MIL/uL — ABNORMAL LOW (ref 4.22–5.81)
RDW: 12.3 % (ref 11.5–15.5)
WBC: 5.4 10*3/uL (ref 4.0–10.5)
nRBC: 0 % (ref 0.0–0.2)

## 2018-03-13 LAB — ECHOCARDIOGRAM LIMITED
Height: 75 in
Weight: 4201.09 oz

## 2018-03-13 LAB — GLUCOSE, CAPILLARY
GLUCOSE-CAPILLARY: 96 mg/dL (ref 70–99)
Glucose-Capillary: 194 mg/dL — ABNORMAL HIGH (ref 70–99)
Glucose-Capillary: 124 mg/dL — ABNORMAL HIGH (ref 70–99)

## 2018-03-13 LAB — MAGNESIUM: Magnesium: 2.2 mg/dL (ref 1.7–2.4)

## 2018-03-13 MED ORDER — IOHEXOL 350 MG/ML SOLN
100.0000 mL | Freq: Once | INTRAVENOUS | Status: AC | PRN
  Administered 2018-03-13: 100 mL via INTRAVENOUS

## 2018-03-13 MED ORDER — BUDESONIDE 0.25 MG/2ML IN SUSP
0.2500 mg | Freq: Two times a day (BID) | RESPIRATORY_TRACT | Status: DC
  Administered 2018-03-13: 0.25 mg via RESPIRATORY_TRACT
  Filled 2018-03-13: qty 2

## 2018-03-13 NOTE — ED Notes (Signed)
Pt and wife given meal tray.

## 2018-03-13 NOTE — Care Management Obs Status (Signed)
[facility] NOTIFICATION   Patient Details  Name: [name] MRN: [id number] Date of Birth: [birth year]   Medicare Observation Status Notification Given:  Yes    Tommy Medal 03/13/2018, 11:52 AM

## 2018-03-13 NOTE — Progress Notes (Signed)
Patient's IV catheter removed and intact. Pt's IV site clean dry and intact. Discharge instructions were reviewed and discussed with patient. All questions were answered and no further questions at this time. Pt in stable condition and in no acute distress at time of distress. Pt escorted by nurse tech.

## 2018-03-13 NOTE — Progress Notes (Signed)
*  PRELIMINARY RESULTS* Echocardiogram Limited 2-D Echocardiogram has been performed.  [name] 03/13/2018, 11:30 AM

## 2018-03-13 NOTE — Discharge Summary (Signed)
Physician Discharge Summary  [name] IZT:[id number] DOB: 1950/02/11 DOA: 03/12/2018  PCP: Dione Housekeeper, MD  Admit date: 03/12/2018 Discharge date: 03/13/2018  Admitted From: Home Disposition: Home  Recommendations for Outpatient Follow-up:  1. Follow up with PCP in 1-2 weeks 2. Please obtain BMP/CBC in one week 3. Follow-up with cardiology has been scheduled.  He can be considered for a stress test at that time.  Discharge Condition: Stable CODE STATUS: Full code Diet recommendation: Heart healthy, carb modified  Brief/Interim Summary: 68 year old male with a history of hypertension, diabetes, diastolic heart failure, presents to the hospital with complaints of chest discomfort.  He was admitted to the hospital and noted to have a nonspecifically elevated troponin to 0.07.  Did not have any acute EKG changes.  He was monitored in the hospital and had resolution of his symptoms without any recurrence.  Echocardiogram was unremarkable and did not show any wall motion abnormalities.  Ejection fraction was normal.  He was seen by cardiology felt that he could discharge home and follow-up as an outpatient.  If his symptoms recurred, outpatient coronary CT angiography could be considered.  The remainder of his medical issues are stable and is felt stable for discharge.  Discharge Diagnoses:  Principal Problem:   Chest pain Active Problems:   Elevated troponin   Chronic diastolic CHF (congestive heart failure) (HCC)   HTN (hypertension)   Asthma   DM2 (diabetes mellitus, type 2) (HCC)   Normal coronary arteries    Discharge Instructions  Discharge Instructions    Diet - low sodium heart healthy   Complete by:  As directed    Increase activity slowly   Complete by:  As directed      Allergies as of 03/13/2018      Reactions   Sulfa Antibiotics Other (See Comments)   Unknown      Medication List    TAKE these medications   aspirin 81 MG tablet Take 81 mg by mouth daily.   hydrochlorothiazide 25 MG tablet Commonly known as:  HYDRODIURIL Take 25 mg by mouth daily.   meloxicam 15 MG tablet Commonly known as:  MOBIC Take 15 mg by mouth daily as needed for pain.   metFORMIN 500 MG tablet Commonly known as:  GLUCOPHAGE Take 500 mg by mouth daily with breakfast.   mometasone 220 MCG/INH inhaler Commonly known as:  ASMANEX Inhale 2 puffs into the lungs at bedtime.   nitroGLYCERIN 0.4 MG SL tablet Commonly known as:  NITROSTAT Place 1 tablet (0.4 mg total) under the tongue every 5 (five) minutes as needed.   pravastatin 40 MG tablet Commonly known as:  PRAVACHOL Take 40 mg by mouth daily.   tamsulosin 0.4 MG Caps capsule Commonly known as:  FLOMAX Take 0.4 mg by mouth daily.   traZODone 50 MG tablet Commonly known as:  DESYREL Take 3 tablets by mouth at bedtime.   verapamil 240 MG 24 hr capsule Commonly known as:  VERELAN PM Take 240 mg by mouth every morning.   Vitamin D3 50 MCG (2000 UT) Tabs Take 2,000 Units by mouth daily.      Follow-up Information    Branch, Alphonse Guild, MD Follow up.   Specialty:  Cardiology Why:  office will contact you  Contact information: [place] [address] [phone]          Allergies  Allergen Reactions  . Sulfa Antibiotics Other (See Comments)    Unknown    Consultations:  Cardiology   Procedures/Studies: Dg Chest 2 View  Result Date: 03/12/2018 CLINICAL DATA:  Chest pain. EXAM: CHEST - 2 VIEW COMPARISON:  November 14, 2015 FINDINGS: The heart size and mediastinal contours are within normal limits. Both lungs are clear. The visualized skeletal structures are unremarkable. IMPRESSION: No active cardiopulmonary disease. Electronically Signed   By: Dorise Bullion III M.D   On: 03/12/2018 18:44   Ct Angio Chest Pe W Or Wo Contrast  Result Date: 03/13/2018 CLINICAL DATA:  Pleuritic chest pain with cough EXAM: CT ANGIOGRAPHY CHEST WITH CONTRAST TECHNIQUE: Multidetector CT  imaging of the chest was performed using the standard protocol during bolus administration of intravenous contrast. Multiplanar CT image reconstructions and MIPs were obtained to evaluate the vascular anatomy. CONTRAST:  136mL OMNIPAQUE IOHEXOL 350 MG/ML SOLN COMPARISON:  None. FINDINGS: Cardiovascular: Satisfactory opacification of the pulmonary arteries to the segmental level. No evidence of pulmonary embolism. Normal heart size. Small volume anterior pericardial fluid Mediastinum/Nodes: Negative for adenopathy. Lungs/Pleura: Trace dependent pleural fluid on the right. Irregular nodule in the left lower lobe, possibly branching and reflecting an obstructed small airway. Average diameter is 1 cm. There is no edema, consolidation, effusion, or pneumothorax. Upper Abdomen: 18 mm cystic density associated with the upper right liver and central diaphragm, overall benign appearing Musculoskeletal: Spondylosis.  No acute or aggressive finding Review of the MIP images confirms the above findings. IMPRESSION: 1. Negative for pulmonary embolism. 2. Trace right pleural fluid and pericardial fluid. 3. 1 cm average diameter nodule in the left lower lobe, possible obstructed distal airway. Recommend noncontrast chest CT in 3 months. Electronically Signed   By: Monte Fantasia M.D.   On: 03/13/2018 06:16       Subjective: No further chest pain or shortness of breath.  Discharge Exam: Vitals:   03/13/18 0500 03/13/18 0525 03/13/18 0906 03/13/18 0925  BP:  (!) 135/92 126/64   Pulse:  (!) 58 74   Resp:  18    Temp:  98.3 F (36.8 C)    TempSrc:  Oral    SpO2:  96%  98%  Weight: 119.1 kg     Height:        General: Pt is alert, awake, not in acute distress Cardiovascular: RRR, S1/S2 +, no rubs, no gallops Respiratory: CTA bilaterally, no wheezing, no rhonchi Abdominal: Soft, NT, ND, bowel sounds + Extremities: no edema, no cyanosis    The results of significant diagnostics from this hospitalization  (including imaging, microbiology, ancillary and laboratory) are listed below for reference.     Microbiology: No results found for this or any previous visit (from the past 240 hour(s)).   Labs: BNP (last 3 results) No results for input(s): BNP in the last 8760 hours. Basic Metabolic Panel: Recent Labs  Lab 03/12/18 1928 03/13/18 0517  NA 138 138  K 3.6 3.8  CL 105 107  CO2 25 26  GLUCOSE 115* 123*  BUN 20 20  CREATININE 0.98 0.87  CALCIUM 8.8* 8.9  MG 2.1 2.2   Liver Function Tests: No results for input(s): AST, ALT, ALKPHOS, BILITOT, PROT, ALBUMIN in the last 168 hours. No results for input(s): LIPASE, AMYLASE in the last 168 hours. No results for input(s): AMMONIA in the last 168 hours. CBC: Recent Labs  Lab 03/12/18 1928 03/13/18 0517  WBC 7.3 5.4  HGB 13.1 11.8*  HCT 40.1 38.0*  MCV 88.3 92.2  PLT 201 168   Cardiac Enzymes: Recent Labs  Lab 03/12/18 1928 03/13/18 0129  03/13/18 0517  TROPONINI 0.07* 0.07* 0.07*   BNP: Invalid input(s): POCBNP CBG: Recent Labs  Lab 03/13/18 0103 03/13/18 0721 03/13/18 1054  GLUCAP 194* 96 124*   D-Dimer No results for input(s): DDIMER in the last 72 hours. Hgb A1c No results for input(s): HGBA1C in the last 72 hours. Lipid Profile No results for input(s): CHOL, HDL, LDLCALC, TRIG, CHOLHDL, LDLDIRECT in the last 72 hours. Thyroid function studies No results for input(s): TSH, T4TOTAL, T3FREE, THYROIDAB in the last 72 hours.  Invalid input(s): FREET3 Anemia work up No results for input(s): VITAMINB12, FOLATE, FERRITIN, TIBC, IRON, RETICCTPCT in the last 72 hours. Urinalysis No results found for: COLORURINE, APPEARANCEUR, LABSPEC, [place], GLUCOSEU, HGBUR, BILIRUBINUR, KETONESUR, PROTEINUR, UROBILINOGEN, NITRITE, LEUKOCYTESUR Sepsis Labs Invalid input(s): PROCALCITONIN,  WBC,  LACTICIDVEN Microbiology No results found for this or any previous visit (from the past 240 hour(s)).   Time coordinating discharge:  49mins  SIGNED:   Kathie Dike, MD  [facility] 03/13/2018, 7:03 PM   If 7PM-7AM, please contact night-coverage [email or web address]

## 2018-03-13 NOTE — Consult Note (Addendum)
Cardiology Consultation:   Patient ID: [name] MRN: [id number]; DOB: Nov 30, 1950  Admit date: 03/12/2018 Date of Consult: 03/13/2018  Primary Care Provider: Dione Housekeeper, MD Primary Cardiologist: Dr Harl Bowie Primary Electrophysiologist:  None    Patient Profile:   [name] is a 68 y.o. male with a hx of normal coronaries who is being seen today for the evaluation of chest pain with an elevated Troponin at the request of Dr Roderic Palau.  History of Present Illness:   [name] is a pleasant 68 year old African-American male who we are asked to see for chest pain with an elevated troponin.  [name] has been evaluated by cardiology in the past.  He had a catheterization in 2002, 2007, in 2017 after an abnormal Myoview.  These have shown no significant coronary disease.  Echocardiogram in November 2017 showed normal LV function with grade 2 diastolic dysfunction.  Other medical problems include non-insulin-dependent diabetes, hypertension, dyslipidemia, palpitations with documented PACs and PVCs in the past, and PTSD, on 80% disability through the [place].  The patient was admitted through the ER at [facility] March 12, 2018.  He developed chest pain at home after church.  He noted a vague chest discomfort that did not go away after he took an aspirin.  He says his symptoms are worse when he sat up or moved, also worse with coughing.  He denies any significant exertional symptoms, he has been walking his dog regularly without difficulty.  He was admitted through the emergency room where his troponin was 0.07.  His troponin has remained 0.07 on subsequent readings.  His EKG shows sinus rhythm with PACs and PVCs.  Chest CTA was negative for pulmonary embolism, he did have an incidental pulmonary nodule noted.  He says overnight in the hospital his symptoms gradually abated.  He does admit to URI a week or so ago and he still has a slight cough from this.  Past Medical History:  Diagnosis  Date  . Asthma   . Diabetes mellitus without complication ([place])   . Dyslipidemia   . Hypertension   . Sleep apnea     Past Surgical History:  Procedure Laterality Date  . CARDIAC CATHETERIZATION N/A 11/18/2015   Procedure: Left Heart Cath and Coronary Angiography;  Surgeon: Belva Crome, MD;  Location: [facility];  Service: Cardiovascular;  Laterality: N/A;     Home Medications:  Prior to Admission medications   Medication Sig Start Date End Date Taking? Authorizing Provider  aspirin 81 MG tablet Take 81 mg by mouth daily.   Yes [provider]  Cholecalciferol (VITAMIN D3) 2000 UNITS TABS Take 2,000 Units by mouth daily.    Yes [provider]  hydrochlorothiazide (HYDRODIURIL) 25 MG tablet Take 25 mg by mouth daily.   Yes [provider]  meloxicam (MOBIC) 15 MG tablet Take 15 mg by mouth daily as needed for pain.   Yes [provider]  metFORMIN (GLUCOPHAGE) 500 MG tablet Take 500 mg by mouth daily with breakfast.   Yes [provider]  metoprolol succinate (TOPROL-XL) 25 MG 24 hr tablet Take 1.5 tablets (37.5 mg total) by mouth daily. 03/24/17  Yes Arnoldo Lenis, MD  mometasone [facility]) 220 MCG/INH inhaler Inhale 2 puffs into the lungs at bedtime.    Yes [provider]  nitroGLYCERIN (NITROSTAT) 0.4 MG SL tablet Place 1 tablet (0.4 mg total) under the tongue every 5 (five) minutes as needed. 10/28/15  Yes Jory Sims  M, NP  pravastatin (PRAVACHOL) 40 MG tablet Take 40 mg by mouth daily.  04/01/14  Yes [provider]  tamsulosin (FLOMAX) 0.4 MG CAPS capsule Take 0.4 mg by mouth daily. 03/28/13  Yes [provider]  traZODone (DESYREL) 50 MG tablet Take 3 tablets by mouth at bedtime. 10/01/17  Yes [provider]  verapamil (VERELAN PM) 240 MG 24 hr capsule Take 240 mg by mouth every morning.    Yes [provider]    Inpatient Medications: Scheduled Meds: . aspirin EC  81  mg Oral Daily  . budesonide (PULMICORT) nebulizer solution  0.25 mg Nebulization BID  . insulin aspart  0-15 Units Subcutaneous TID WC  . metoprolol succinate  37.5 mg Oral Daily  . pravastatin  40 mg Oral q1800   Continuous Infusions:  PRN Meds: albuterol, morphine injection, nitroGLYCERIN, ondansetron **OR** ondansetron (ZOFRAN) IV, traZODone  Allergies:    Allergies  Allergen Reactions  . Sulfa Antibiotics Other (See Comments)    Unknown    Social History:   Social History   Socioeconomic History  . Marital status: Married    Spouse name: Not on file  . Number of children: Not on file  . Years of education: Not on file  . Highest education level: Not on file  Occupational History  . Not on file  Social Needs  . Financial resource strain: Patient refused  . Food insecurity:    Worry: Patient refused    Inability: Patient refused  . Transportation needs:    Medical: Patient refused    Non-medical: Patient refused  Tobacco Use  . Smoking status: Former Smoker    Types: Cigars    Start date: 01/05/1976    Last attempt to quit: 01/05/1987    Years since quitting: 31.2  . Smokeless tobacco: Never Used  Substance and Sexual Activity  . Alcohol use: No    Alcohol/week: 0.0 standard drinks  . Drug use: No  . Sexual activity: Not on file  Lifestyle  . Physical activity:    Days per week: Patient refused    Minutes per session: Patient refused  . Stress: Patient refused  Relationships  . Social connections:    Talks on phone: Not on file    Gets together: Not on file    Attends religious service: Patient refused    Active member of club or organization: Patient refused    Attends meetings of clubs or organizations: Patient refused    Relationship status: Patient refused  . Intimate partner violence:    Fear of current or ex partner: Patient refused    Emotionally abused: Patient refused    Physically abused: Patient refused    Forced sexual activity: Patient  refused  Other Topics Concern  . Not on file  Social History Narrative  . Not on file    Family History:    Family History  Problem Relation Age of Onset  . Hypertension Mother   . Heart attack Mother   . Stomach cancer Mother   . Heart disease Mother   . Diabetes Father   . Heart attack Father   . Heart disease Father   . Diabetes Sister   . Liver cancer Sister   . Heart attack Brother   . Heart disease Brother   . Heart attack Paternal Grandfather   . Heart disease Paternal Grandfather      ROS:  Please see the history of present illness.  All other ROS reviewed and negative.  Physical Exam/Data:   Vitals:   03/13/18 0500 03/13/18 0525 03/13/18 0906 03/13/18 0925  BP:  (!) 135/92 126/64   Pulse:  (!) 58 74   Resp:  18    Temp:  98.3 F (36.8 C)    TempSrc:  Oral    SpO2:  96%  98%  Weight: 119.1 kg     Height:       No intake or output data in the 24 hours ending 03/13/18 1006 Last 3 Weights 03/13/2018 03/12/2018 06/23/2016  Weight (lbs) 262 lb 9.1 oz 275 lb 265 lb  Weight (kg) 119.1 kg 124.739 kg 120.203 kg     Body mass index is 32.82 kg/m.  General:  Well nourished, well developed, in no acute distress HEENT: normal Lymph: no adenopathy Neck: no JVD Endocrine:  No thryomegaly Vascular: No carotid bruits; FA pulses 2+ bilaterally without bruits  Cardiac:  normal S1, S2; RRR; no murmur  Lungs:  faint crackles RUL  Abd: soft, nontender, no hepatomegaly  Ext: no edema Musculoskeletal:  No deformities, BUE and BLE strength normal and equal Skin: warm and dry  Neuro:  CNs 2-12 intact, no focal abnormalities noted Psych:  Normal affect   EKG:  The EKG was personally reviewed and demonstrates:  NSR, PACs, PVCs Telemetry:  Telemetry was personally reviewed and demonstrates:  NSR, SB, PACs PVCs  Relevant CV Studies:   Laboratory Data:  Chemistry Recent Labs  Lab 03/12/18 1928 03/13/18 0517  NA 138 138  K 3.6 3.8  CL 105 107  CO2 25 26    GLUCOSE 115* 123*  BUN 20 20  CREATININE 0.98 0.87  CALCIUM 8.8* 8.9  GFRNONAA >60 >60  GFRAA >60 >60  ANIONGAP 8 5    No results for input(s): PROT, ALBUMIN, AST, ALT, ALKPHOS, BILITOT in the last 168 hours. Hematology Recent Labs  Lab 03/12/18 1928 03/13/18 0517  WBC 7.3 5.4  RBC 4.54 4.12*  HGB 13.1 11.8*  HCT 40.1 38.0*  MCV 88.3 92.2  MCH 28.9 28.6  MCHC 32.7 31.1  RDW 12.2 12.3  PLT 201 168   Cardiac Enzymes Recent Labs  Lab 03/12/18 1928 03/13/18 0129 03/13/18 0517  TROPONINI 0.07* 0.07* 0.07*   No results for input(s): TROPIPOC in the last 168 hours.  BNPNo results for input(s): BNP, PROBNP in the last 168 hours.  DDimer No results for input(s): DDIMER in the last 168 hours.  Radiology/Studies:  Dg Chest 2 View  Result Date: 03/12/2018 CLINICAL DATA:  Chest pain. EXAM: CHEST - 2 VIEW COMPARISON:  November 14, 2015 FINDINGS: The heart size and mediastinal contours are within normal limits. Both lungs are clear. The visualized skeletal structures are unremarkable. IMPRESSION: No active cardiopulmonary disease. Electronically Signed   By: Dorise Bullion III M.D   On: 03/12/2018 18:44   Ct Angio Chest Pe W Or Wo Contrast  Result Date: 03/13/2018 CLINICAL DATA:  Pleuritic chest pain with cough EXAM: CT ANGIOGRAPHY CHEST WITH CONTRAST TECHNIQUE: Multidetector CT imaging of the chest was performed using the standard protocol during bolus administration of intravenous contrast. Multiplanar CT image reconstructions and MIPs were obtained to evaluate the vascular anatomy. CONTRAST:  126mL OMNIPAQUE IOHEXOL 350 MG/ML SOLN COMPARISON:  None. FINDINGS: Cardiovascular: Satisfactory opacification of the pulmonary arteries to the segmental level. No evidence of pulmonary embolism. Normal heart size. Small volume anterior pericardial fluid Mediastinum/Nodes: Negative for adenopathy. Lungs/Pleura: Trace dependent pleural fluid on the right. Irregular nodule in the left lower lobe,  possibly  branching and reflecting an obstructed small airway. Average diameter is 1 cm. There is no edema, consolidation, effusion, or pneumothorax. Upper Abdomen: 18 mm cystic density associated with the upper right liver and central diaphragm, overall benign appearing Musculoskeletal: Spondylosis.  No acute or aggressive finding Review of the MIP images confirms the above findings. IMPRESSION: 1. Negative for pulmonary embolism. 2. Trace right pleural fluid and pericardial fluid. 3. 1 cm average diameter nodule in the left lower lobe, possible obstructed distal airway. Recommend noncontrast chest CT in 3 months. Electronically Signed   By: Monte Fantasia M.D.   On: 03/13/2018 06:16    Assessment and Plan:   Chest pain- Sounds atypical for angina  Elevated Troponin- Flat trend not suggestive of ischemia. ? Secondary to frequent ectopy  Normal coronaries  Cath 2002, 2007, 2017 after abnormal Myoview  HTN- B/P not significantly elevated on admission. Grade 2 DD on echo 2017- no symptoms to suggest CHF  Ectopy Frequent PACs and PVCs- chronic  NIDDM- On oral agents  Plan- Not sure further inpatient work up needed.  Consider OP echo and ZIO.  We could also consider OP EP evaluation if it was felt his ectopy was symptomatic.       For questions or updates, please contact [place] Please consult [email or web address] for contact info under     Signed, Kerin Ransom, PA-C  03/13/2018 10:06 AM   The patient was seen and examined, and I agree with the history, physical exam, assessment and plan as documented above, with modifications as noted below. I have also personally reviewed all relevant documentation, old records, labs, and both radiographic and cardiovascular studies. I have also independently interpreted old and new ECG's.  Briefly, this is a 68 year old male who has been evaluated by Dr. Harl Bowie in the past, most recently in June 2018.  He has a history of chest pain and underwent  a normal coronary angiogram in November 2017.  He also has a history of palpitations.  He presented with retrosternal chest pain which began after coming home from church and prior to eating lunch.  Symptoms lasted for several minutes so he came with his wife to the ED.  Troponins have been nonspecifically elevated at 0.07.  I reviewed the current ECG and prior ECGs from 2017 and 2016 and have been no significant changes.  He denies dysphagia for solids or liquids.  He denies acid reflux symptoms.  Coronary CT angiogram was negative for pulmonary embolus.  There is no mention of coronary artery calcifications.  There was trace right pleural fluid and pericardial fluid.  He is currently symptom-free.  I think he can be safely discharged.  I will obtain a limited echocardiogram beforehand to assess for any significant pericardial effusion.   If he continues to have recurrent symptoms, outpatient coronary CT angiography could be considered.   Kate Sable, MD, [facility]  03/13/2018 10:46 AM

## 2018-03-14 LAB — HIV ANTIBODY (ROUTINE TESTING W REFLEX): HIV Screen 4th Generation wRfx: NONREACTIVE

## 2018-03-31 ENCOUNTER — Telehealth: Payer: Self-pay | Admitting: *Deleted

## 2018-03-31 NOTE — Telephone Encounter (Signed)

## 2018-04-05 ENCOUNTER — Ambulatory Visit: Payer: BC Managed Care – PPO | Admitting: Student

## 2018-05-02 ENCOUNTER — Telehealth: Payer: Self-pay | Admitting: Cardiology

## 2018-05-02 NOTE — Telephone Encounter (Signed)
Pt agreed w/ consent.  YOUR CARDIOLOGY TEAM HAS ARRANGED FOR AN E-VISIT FOR YOUR APPOINTMENT - PLEASE REVIEW IMPORTANT INFORMATION BELOW SEVERAL DAYS PRIOR TO YOUR APPOINTMENT  Due to the recent COVID-19 pandemic, we are transitioning in-person office visits to tele-medicine visits in an effort to decrease unnecessary exposure to our patients and staff. Medicare and most insurances are covering these visits without a copay needed. We also encourage you to sign up for MyChart if you have not already done so. You will need a smartphone if possible. For patients that do not have this, we can still complete the visit using a regular telephone but do prefer a smartphone to enable video when possible. You may have a close family member that lives with you that can help. If possible, we also ask that you have a blood pressure cuff and scale at home to measure your blood pressure, heart rate and weight prior to your scheduled appointment. Patients with clinical needs that need an in-person evaluation and testing will still be able to come to the office if absolutely necessary. If you have any questions, feel free to call our office.    IF YOU HAVE A SMARTPHONE, PLEASE DOWNLOAD THE WEBEX APP TO YOUR SMARTPHONE  - If Apple, go to [employer] and type in WebEx in the search bar. [place] [employer], the blue/green circle. The app is free but as with any other app download, your phone may require you to verify saved payment information or Apple password. You do NOT have to create a WebEx account.  - If Android, go to [employer] and type in [employer] in the search bar. [place] [employer], the blue/green circle. The app is free but as with any other app download, your phone may require you to verify saved payment information or Android password. You do NOT have to create a WebEx account.  It is very helpful to have this downloaded before your visit.    2-3 DAYS BEFORE YOUR  APPOINTMENT  You will receive a telephone call from one of our [place] team members - your caller ID may say "Unknown caller." If this is a video visit, we will confirm that you have been able to download the WebEx app. We will remind you check your blood pressure, heart rate and weight prior to your scheduled appointment. If you have an Apple Watch or Kardia, please upload any pertinent ECG strips the day before or morning of your appointment to [place]. Our staff will also make sure you have reviewed the consent and agree to move forward with your scheduled tele-health visit.     THE DAY OF YOUR APPOINTMENT  Approximately 15 minutes prior to your scheduled appointment, you will receive a telephone call from one of [facility] team - your caller ID may say "Unknown caller."  Our staff will confirm medications, vital signs for the day and any symptoms you may be experiencing. Please have this information available prior to the time of visit start. It may also be helpful for you to have a pad of paper and pen handy for any instructions given during your visit. They will also walk you through joining the WebEx smartphone meeting if this is a video visit.    CONSENT FOR TELE-HEALTH VISIT - PLEASE REVIEW  I hereby voluntarily request, consent and authorize [facility] and its employed or contracted physicians, physician assistants, nurse practitioners or other licensed health care professionals (the Practitioner), to provide me with telemedicine health  care services (the “Services") as deemed necessary by the treating Practitioner. I acknowledge and consent to receive the Services by the Practitioner via telemedicine. I understand that the telemedicine visit will involve communicating with the Practitioner through live audiovisual communication technology and the disclosure of certain medical information by electronic transmission. I acknowledge that I have been given the opportunity to request an  in-person assessment or other available alternative prior to the telemedicine visit and am voluntarily participating in the telemedicine visit. ° °I understand that I have the right to withhold or withdraw my consent to the use of telemedicine in the course of my care at any time, without affecting my right to future care or treatment, and that the Practitioner or I may terminate the telemedicine visit at any time. I understand that I have the right to inspect all information obtained and/or recorded in the course of the telemedicine visit and may receive copies of available information for a reasonable fee.  I understand that some of the potential risks of receiving the Services via telemedicine include:  °• Delay or interruption in medical evaluation due to technological equipment failure or disruption; °• Information transmitted may not be sufficient (e.g. poor resolution of images) to allow for appropriate medical decision making by the Practitioner; and/or  °• In rare instances, security protocols could fail, causing a breach of personal health information. ° °Furthermore, I acknowledge that it is my responsibility to provide information about my medical history, conditions and care that is complete and accurate to the best of my ability. I acknowledge that Practitioner's advice, recommendations, and/or decision may be based on factors not within their control, such as incomplete or inaccurate data provided by me or distortions of diagnostic images or specimens that may result from electronic transmissions. I understand that the practice of medicine is not an exact science and that Practitioner makes no warranties or guarantees regarding treatment outcomes. I acknowledge that I will receive a copy of this consent concurrently upon execution via email to the email address I last provided but may also request a printed copy by calling the office of [facility].   ° °I understand that my insurance will be  billed for this visit.  ° °I have read or had this consent read to me. °• I understand the contents of this consent, which adequately explains the benefits and risks of the Services being provided via telemedicine.  °• I have been provided ample opportunity to ask questions regarding this consent and the Services and have had my questions answered to my satisfaction. °• I give my informed consent for the services to be provided through the use of telemedicine in my medical care ° °By participating in this telemedicine visit I agree to the above. ° °

## 2018-05-05 ENCOUNTER — Encounter: Payer: Self-pay | Admitting: Student

## 2018-05-05 ENCOUNTER — Telehealth (INDEPENDENT_AMBULATORY_CARE_PROVIDER_SITE_OTHER): Payer: Medicare Other | Admitting: Student

## 2018-05-05 VITALS — BP 140/84 | HR 57 | Ht 75.0 in | Wt 268.2 lb

## 2018-05-05 DIAGNOSIS — Z7189 Other specified counseling: Secondary | ICD-10-CM

## 2018-05-05 DIAGNOSIS — R0781 Pleurodynia: Secondary | ICD-10-CM | POA: Diagnosis not present

## 2018-05-05 DIAGNOSIS — R911 Solitary pulmonary nodule: Secondary | ICD-10-CM

## 2018-05-05 DIAGNOSIS — I10 Essential (primary) hypertension: Secondary | ICD-10-CM

## 2018-05-05 DIAGNOSIS — E782 Mixed hyperlipidemia: Secondary | ICD-10-CM

## 2018-05-05 DIAGNOSIS — R002 Palpitations: Secondary | ICD-10-CM

## 2018-05-05 MED ORDER — NITROGLYCERIN 0.4 MG SL SUBL: 0.4000 mg | SUBLINGUAL_TABLET | SUBLINGUAL | 3 refills | Status: DC | PRN

## 2018-05-05 MED ORDER — METOPROLOL SUCCINATE ER 25 MG PO TB24: 37.5000 mg | ORAL_TABLET | Freq: Every day | ORAL | 3 refills | Status: AC

## 2018-05-05 MED ORDER — VERAPAMIL HCL ER 240 MG PO CP24: 240.0000 mg | ORAL_CAPSULE | ORAL | 3 refills | Status: DC

## 2018-05-05 NOTE — Progress Notes (Signed)
Virtual Visit via Telephone Note   This visit type was conducted due to national recommendations for restrictions regarding the COVID-19 Pandemic (e.g. social distancing) in an effort to limit this patient's exposure and mitigate transmission in our community.  Due to his co-morbid illnesses, this patient is at least at moderate risk for complications without adequate follow up.  This format is felt to be most appropriate for this patient at this time.  The patient did not have access to video technology/had technical difficulties with video requiring transitioning to audio format only (telephone). We attempted [email or web address] but the patient was unable to get his webcam to work.  All issues noted in this document were discussed and addressed.  No physical exam could be performed with this format.  Please refer to the patient's chart for his  consent to telehealth for [facility]   Date:  05/05/2018   ID:  [name], DOB 01-19-1950, MRN [id number]  Patient Location: Home Provider Location: Home  PCP:  Dione Housekeeper, MD  Cardiologist:  Carlyle Dolly, MD  Electrophysiologist:  None   Evaluation Performed:  Follow-Up Visit  Chief Complaint: Hospital Follow-Up  History of Present Illness:    [name] is a 68 y.o. male with past medical history of normal cors by catheterization in 2002, 2007, and 2017, Type 2 DM, HTN, HLD, palpitations (PAC's and PVC's), asthma, and PTSD who presents for a telemedicine visit for hospital follow-up.   He was most recently admitted to [facility] in 03/2018 for evaluation of chest pain. He reported a discomfort along his precordium which was worse with movement or coughing. Troponin values were flat at 0.07 and EKG showed PAC's and PVC's with no acute ischemic changes. CTA was negative for PE but he did have a pulmonary nodule noted with repeat imaging recommended in 3 months. A limited echocardiogram was obtained and showed a preserved EF of 55-60% with no  regional WMA and no evidence of a pericardial effusion. No changes were made to his medication regimen and it was recommended to consider a Coronary CT as an outpatient if he had recurrent symptoms   In talking with the patient today, he reports overall doing well from a cardiac perspective since his recent hospitalization. He denies any recurrent chest pain. He does not exercise regularly but reports being active in doing yard work around the house and denies any anginal symptoms with this. He does have baseline dyspnea on exertion in the setting of asthma but no recent change in his symptoms. Denies any recent orthopnea, PND, or edema. Weight has been stable on his home scales.   Experiences occasional palpitations but says symptoms have improved over the past several years. Denies any alcohol intake but does consume 2-3 cups of coffee or tea daily.   The patient does not have symptoms concerning for COVID-19 infection (fever, chills, cough, or new shortness of breath).    Past Medical History:  Diagnosis Date  . Asthma   . Diabetes mellitus without complication ([place])   . Dyslipidemia   . Hypertension   . Sleep apnea    Past Surgical History:  Procedure Laterality Date  . CARDIAC CATHETERIZATION N/A 11/18/2015   Procedure: Left Heart Cath and Coronary Angiography;  Surgeon: Belva Crome, MD;  Location: [facility];  Service: Cardiovascular;  Laterality: N/A;     Current Meds  Medication Sig  . ALPRAZolam (XANAX) 1 MG tablet Take 1 mg by mouth at bedtime as needed  for anxiety.  [name] aspirin 81 MG tablet Take 81 mg by mouth daily.  . Cholecalciferol (VITAMIN D3) 2000 UNITS TABS Take 2,000 Units by mouth daily.   . hydrochlorothiazide (HYDRODIURIL) 25 MG tablet Take 25 mg by mouth daily.  . meloxicam (MOBIC) 15 MG tablet Take 15 mg by mouth daily as needed for pain.  . metFORMIN (GLUCOPHAGE) 500 MG tablet Take 500 mg by mouth daily with breakfast.  . metoprolol succinate  (TOPROL-XL) 25 MG 24 hr tablet Take 1.5 tablets (37.5 mg total) by mouth daily.  . mometasone (ASMANEX) 220 MCG/INH inhaler Inhale 2 puffs into the lungs at bedtime.   . nitroGLYCERIN (NITROSTAT) 0.4 MG SL tablet Place 1 tablet (0.4 mg total) under the tongue every 5 (five) minutes as needed.  . pravastatin (PRAVACHOL) 40 MG tablet Take 40 mg by mouth daily.   . tamsulosin (FLOMAX) 0.4 MG CAPS capsule Take 0.4 mg by mouth daily.  . traZODone (DESYREL) 50 MG tablet Take 3 tablets by mouth at bedtime.  . verapamil (VERELAN PM) 240 MG 24 hr capsule Take 1 capsule (240 mg total) by mouth every morning.  . [DISCONTINUED] metoprolol succinate (TOPROL-XL) 25 MG 24 hr tablet TAKE 1 AND 1/2 TABLETS BY MOUTH DAILY.  . [DISCONTINUED] nitroGLYCERIN (NITROSTAT) 0.4 MG SL tablet Place 1 tablet (0.4 mg total) under the tongue every 5 (five) minutes as needed.  . [DISCONTINUED] verapamil (VERELAN PM) 240 MG 24 hr capsule Take 240 mg by mouth every morning.      Allergies:   Sulfa antibiotics   Social History   Tobacco Use  . Smoking status: Former Smoker    Types: Cigars    Start date: 01/05/1976    Last attempt to quit: 01/05/1987    Years since quitting: 31.3  . Smokeless tobacco: Never Used  Substance Use Topics  . Alcohol use: No    Alcohol/week: 0.0 standard drinks  . Drug use: No     Family Hx: The patient's family history includes Diabetes in his father and sister; Heart attack in his brother, father, mother, and paternal grandfather; Heart disease in his brother, father, mother, and paternal grandfather; Hypertension in his mother; Liver cancer in his sister; Stomach cancer in his mother.  ROS:   Please see the history of present illness.     All other systems reviewed and are negative.   Prior CV studies:   The following studies were reviewed today:  Cardiac Catheterization: 11/2015  The left ventricular ejection fraction is 45-50% by visual estimate.  There is mild left ventricular  systolic dysfunction.  LV end diastolic pressure is normal.  There is no mitral valve regurgitation.    Essentially normal coronary arteries for age. Right dominant coronary pattern.  Low-normal to mildly depressed LV function estimated to be 45-50%. Normal LVEDP.  RECOMMENDATIONS:   No explanation for the patient's chest discomfort.  LV function is mildly depressed but not into the range suggested by the noninvasive studies.  Echocardiogram: 03/2018 IMPRESSIONS   1. The left ventricle has normal systolic function, with an ejection fraction of 55-60%. The cavity size was normal. Left ventricular diastolic parameters were normal No evidence of left ventricular regional wall motion abnormalities.  2. Trivial pericardial effusion is present.  3. The mitral valve is normal in structure.  4. The tricuspid valve was normal in structure.  5. The aortic valve is tricuspid.  6. No evidence of left ventricular regional wall motion abnormalities.  Labs/Other Tests and Data Reviewed:  EKG:  An ECG dated 03/12/2018 was personally reviewed today and demonstrated:  NSR, HR 79, with frequent PVC's.   Recent Labs: 03/13/2018: BUN 20; Creatinine, Ser 0.87; Hemoglobin 11.8; Magnesium 2.2; Platelets 168; Potassium 3.8; Sodium 138   Recent Lipid Panel No results found for: CHOL, TRIG, HDL, CHOLHDL, LDLCALC, LDLDIRECT  Wt Readings from Last 3 Encounters:  05/05/18 268 lb 3.2 oz (121.7 kg)  03/13/18 262 lb 9.1 oz (119.1 kg)  06/23/16 265 lb (120.2 kg)     Objective:    Vital Signs:  BP 140/84   Pulse (!) 57   Ht 6\' 3"  (1.905 m)   Wt 268 lb 3.2 oz (121.7 kg)   BMI 33.52 kg/m    General: Pleasant, male sounding in NAD Psych: Normal affect. Neuro: Alert and oriented X 3.  Lungs:  Resp regular and unlabored while talking on the phone.  ASSESSMENT & PLAN:    1. Pleuritic Chest Pain - recently admitted for evaluation of chest pain which was thought to be atypical for angina. Enzymes  remained flat at 0.07 and EKG showed no acute ischemic changes. Echo showed a preserved EF with no regional WMA.  - he denies any recurrent symptoms since hospital discharge. Feels back to baseline.  - given normal cors by catheterization in 2017 as outlined above and no recurrent symptoms, will continue with risk factor modification at this time. We did review that a Coronary CT could be obtained for repeat evaluation should he develop recurrent symptoms. Rx for SL NTG refilled.   2. Palpitations - found to have PAC's and PVC's by prior monitoring and EKG's. - continue Verapamil and Toprol-XL at current dosing. Would not further titrate for now given HR in the 50's.   3. HTN - BP has overall been well-controlled when checked at home, slightly elevated at 140/84 when checked today. I encouraged him to continue to follow this. Remains on HCTZ 25mg  daily, Toprol-XL 37.5mg  daily, and Verapamil 240mg  daily.  4. HLD - FLP in 06/2017 showed total cholesterol of 31, HDL 41, Triglycerides 89, and LDL 72. He remains on Pravastatin 40mg  daily.   5. Pulmonary Nodule - noted on CTA during admission. Reviewed findings with the patient and recommended he further discuss with his PCP and arrange for repeat imaging within the next 3-6 months once the COVID-19 situation has improved.   6. COVID-19 Education - The signs and symptoms of COVID-19 were discussed with the patient. The importance of social distancing was discussed today.  Time:   Today, I have spent 19 minutes with the patient with telehealth technology discussing the above problems.     Medication Adjustments/Labs and Tests Ordered: Current medicines are reviewed at length with the patient today.  Concerns regarding medicines are outlined above.   Tests Ordered: No orders of the defined types were placed in this encounter.   Medication Changes: Meds ordered this encounter  Medications  . nitroGLYCERIN (NITROSTAT) 0.4 MG SL tablet     Sig: Place 1 tablet (0.4 mg total) under the tongue every 5 (five) minutes as needed.    Dispense:  25 tablet    Refill:  3    Order Specific Question:   Supervising Provider    Answer:   Dorothy Spark [id number]  . verapamil (VERELAN PM) 240 MG 24 hr capsule    Sig: Take 1 capsule (240 mg total) by mouth every morning.    Dispense:  90 capsule    Refill:  3  Order Specific Question:   Supervising Provider    Answer:   Dorothy Spark [[id number]]  . metoprolol succinate (TOPROL-XL) 25 MG 24 hr tablet    Sig: Take 1.5 tablets (37.5 mg total) by mouth daily.    Dispense:  135 tablet    Refill:  3    Order Specific Question:   Supervising Provider    Answer:   Dorothy Spark [[id number]]    Disposition:  Follow up with Dr. Harl Bowie in 1 year unless clinically indicated sooner.   Signed, Erma Heritage, PA-C  05/05/2018 4:10 PM    [facility]

## 2018-05-05 NOTE — Patient Instructions (Signed)
Medication Instructions:  Your physician recommends that you continue on your current medications as directed. Please refer to the Current Medication list given to you today.   Labwork: NONE   Testing/Procedures: NONE   Follow-Up: Your physician wants you to follow-up in: 1 Year with Dr. Branch.  You will receive a reminder letter in the mail two months in advance. If you don't receive a letter, please call our office to schedule the follow-up appointment.   Any Other Special Instructions Will Be Listed Below (If Applicable).     If you need a refill on your cardiac medications before your next appointment, please call your pharmacy. Thank you for choosing [facility]!    

## 2018-06-15 ENCOUNTER — Other Ambulatory Visit (HOSPITAL_COMMUNITY): Payer: Self-pay | Admitting: Adult Health Nurse Practitioner

## 2018-06-15 ENCOUNTER — Other Ambulatory Visit: Payer: Self-pay | Admitting: Family Medicine

## 2018-06-15 ENCOUNTER — Other Ambulatory Visit: Payer: Self-pay | Admitting: Adult Health Nurse Practitioner

## 2018-06-19 ENCOUNTER — Other Ambulatory Visit (HOSPITAL_COMMUNITY): Payer: Self-pay | Admitting: Adult Health Nurse Practitioner

## 2018-06-19 ENCOUNTER — Other Ambulatory Visit: Payer: Self-pay | Admitting: Adult Health Nurse Practitioner

## 2018-06-19 DIAGNOSIS — R911 Solitary pulmonary nodule: Secondary | ICD-10-CM

## 2018-07-04 ENCOUNTER — Other Ambulatory Visit: Payer: Self-pay

## 2018-07-04 ENCOUNTER — Ambulatory Visit (HOSPITAL_COMMUNITY)
Admission: RE | Admit: 2018-07-04 | Discharge: 2018-07-04 | Disposition: A | Discharge status: Home / Self Care | Payer: Medicare Other | Source: Ambulatory Visit | Attending: Adult Health Nurse Practitioner | Admitting: Adult Health Nurse Practitioner

## 2018-07-04 DIAGNOSIS — R911 Solitary pulmonary nodule: Secondary | ICD-10-CM | POA: Diagnosis present

## 2019-06-01 ENCOUNTER — Ambulatory Visit (HOSPITAL_COMMUNITY)
Admission: RE | Admit: 2019-06-01 | Discharge: 2019-06-01 | Disposition: A | Discharge status: Home / Self Care | Payer: Medicare Other | Source: Ambulatory Visit | Attending: Adult Health Nurse Practitioner | Admitting: Adult Health Nurse Practitioner

## 2019-06-01 ENCOUNTER — Other Ambulatory Visit: Payer: Self-pay

## 2019-06-01 ENCOUNTER — Other Ambulatory Visit (HOSPITAL_COMMUNITY): Payer: Self-pay | Admitting: Adult Health Nurse Practitioner

## 2019-06-01 DIAGNOSIS — M25512 Pain in left shoulder: Secondary | ICD-10-CM

## 2019-07-09 ENCOUNTER — Other Ambulatory Visit: Payer: Self-pay | Admitting: Student

## 2019-07-10 NOTE — Telephone Encounter (Signed)
This is a [facility] pt.  °

## 2019-08-10 ENCOUNTER — Other Ambulatory Visit: Payer: Self-pay

## 2019-08-10 ENCOUNTER — Ambulatory Visit (INDEPENDENT_AMBULATORY_CARE_PROVIDER_SITE_OTHER): Payer: Medicare Other | Admitting: Cardiology

## 2019-08-10 ENCOUNTER — Encounter: Payer: Self-pay | Admitting: Cardiology

## 2019-08-10 VITALS — BP 150/98 | HR 63 | Ht 75.0 in | Wt 264.0 lb

## 2019-08-10 DIAGNOSIS — Z01818 Encounter for other preprocedural examination: Secondary | ICD-10-CM | POA: Diagnosis not present

## 2019-08-10 DIAGNOSIS — R0789 Other chest pain: Secondary | ICD-10-CM | POA: Diagnosis not present

## 2019-08-10 DIAGNOSIS — R002 Palpitations: Secondary | ICD-10-CM | POA: Diagnosis not present

## 2019-08-10 DIAGNOSIS — I259 Chronic ischemic heart disease, unspecified: Secondary | ICD-10-CM

## 2019-08-10 DIAGNOSIS — I10 Essential (primary) hypertension: Secondary | ICD-10-CM

## 2019-08-10 DIAGNOSIS — I209 Angina pectoris, unspecified: Secondary | ICD-10-CM

## 2019-08-10 MED ORDER — METOPROLOL TARTRATE 100 MG PO TABS: ORAL_TABLET | ORAL | 0 refills | Status: DC

## 2019-08-10 MED ORDER — METOPROLOL TARTRATE 100 MG PO TABS: 100.0000 mg | ORAL_TABLET | Freq: Two times a day (BID) | ORAL | 0 refills | Status: DC

## 2019-08-10 NOTE — Patient Instructions (Signed)
Medication Instructions:  Your physician recommends that you continue on your current medications as directed. Please refer to the Current Medication list given to you today.  *If you need a refill on your cardiac medications before your next appointment, please call your pharmacy*   Lab Work: BMET 3 days BEFORE Cardiac CT  If you have labs (blood work) drawn today and your tests are completely normal, you will receive your results only by: [name] MyChart Message (if you have MyChart) OR . A paper copy in the mail If you have any lab test that is abnormal or we need to change your treatment, we will call you to review the results.   Testing/Procedures: Your physician has requested that you have cardiac CT. Cardiac computed tomography (CT) is a painless test that uses an x-ray machine to take clear, detailed pictures of your heart. For further information please visit [email or web address].[email or web address]. Please follow instruction sheet as given.     Follow-Up: At [facility], you and your health needs are our priority.  As part of our continuing mission to provide you with exceptional heart care, we have created designated Provider Care Teams.  These Care Teams include your primary Cardiologist (physician) and Advanced Practice Providers (APPs -  Physician Assistants and Nurse Practitioners) who all work together to provide you with the care you need, when you need it.  We recommend signing up for the patient portal called "MyChart".  Sign up information is provided on this After Visit Summary.  MyChart is used to connect with patients for Virtual Visits (Telemedicine).  Patients are able to view lab/test results, encounter notes, upcoming appointments, etc.  Non-urgent messages can be sent to your provider as well.   To learn more about what you can do with MyChart, go to [email or web address].    Your next appointment:   6 month(s)  The format for your next appointment:   In Person  Provider:    Carlyle Dolly, MD   Other Instructions None       Thank you for choosing [facility] !

## 2019-08-10 NOTE — Progress Notes (Signed)
Clinical Summary [name] is a 69 y.o.male seen today for follow up of the following medical problems.   1. Palpitations  - 7 day event monitor showed NSR, sinus brady high 50s, and occasional PACs correlating with his symptoms. - he had done well on verapamil and Toprol XL, he states however when he changed his meds over to the [place] his Toprol XL was changed to lopressor 12.5mg  bid and since then symptoms have started again.   - some palpitatons at times, some increase in frequency. Few skips at a time.  - stable symptoms overall.   2. HTN - he is compliant with emds  3. Hyperlipidemia - compliant with statin, labs are followed at [facility]  4. 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 No current ischemia.  - 11/2015 heart cath: without significant CAD - 11/2015 echo LVEF 55%, grade II diastolic dysfunction  - dull pain left sided. 4-5/10 in severity. No other associated symptoms. Not positional. Lasts few seconds, occurs few times a week. Can occur at rest or with exertion.       5. 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    Past Medical History:  Diagnosis Date  . Asthma   . Diabetes mellitus without complication ([place])   . Dyslipidemia   . Hypertension   . Sleep apnea      Allergies  Allergen Reactions  . Sulfa Antibiotics Other (See Comments)    Unknown     Current Outpatient Medications  Medication Sig Dispense Refill  . ALPRAZolam (XANAX) 1 MG tablet Take 1 mg by mouth at bedtime as needed for anxiety.    [name] aspirin 81 MG tablet Take 81 mg by mouth daily.    . Cholecalciferol (VITAMIN D3) 2000 UNITS TABS Take 2,000 Units by mouth daily.     . hydrochlorothiazide (HYDRODIURIL) 25 MG tablet Take 25 mg by mouth daily.    . meloxicam (MOBIC) 15 MG tablet Take 15 mg by  mouth daily as needed for pain.    . metFORMIN (GLUCOPHAGE) 500 MG tablet Take 500 mg by mouth daily with breakfast.    . metoprolol succinate (TOPROL-XL) 25 MG 24 hr tablet Take 1.5 tablets (37.5 mg total) by mouth daily. 135 tablet 3  . mometasone (ASMANEX) 220 MCG/INH inhaler Inhale 2 puffs into the lungs at bedtime.     . nitroGLYCERIN (NITROSTAT) 0.4 MG SL tablet DISSOLVE ONE TABLET UNDER TONGUE EVERY 5 MINUTES UP TO 3 DOSES AS NEEDED FOR CHEST PAIN 25 tablet 3  . pravastatin (PRAVACHOL) 40 MG tablet Take 40 mg by mouth daily.     . tamsulosin (FLOMAX) 0.4 MG CAPS capsule Take 0.4 mg by mouth daily.    . traZODone (DESYREL) 50 MG tablet Take 3 tablets by mouth at bedtime.    . verapamil (VERELAN PM) 240 MG 24 hr capsule Take 1 capsule (240 mg total) by mouth every morning. 90 capsule 3   No current facility-administered medications for this visit.     Past Surgical History:  Procedure Laterality Date  . CARDIAC CATHETERIZATION N/A 11/18/2015   Procedure: Left Heart Cath and Coronary Angiography;  Surgeon: Belva Crome, MD;  Location: [facility];  Service: Cardiovascular;  Laterality: N/A;     Allergies  Allergen  Reactions  . Sulfa Antibiotics Other (See Comments)    Unknown      Family History  Problem Relation Age of Onset  . Hypertension Mother   . Heart attack Mother   . Stomach cancer Mother   . Heart disease Mother   . Diabetes Father   . Heart attack Father   . Heart disease Father   . Diabetes Sister   . Liver cancer Sister   . Heart attack Brother   . Heart disease Brother   . Heart attack Paternal Grandfather   . Heart disease Paternal Grandfather      Social History [name] reports that he quit smoking about 32 years ago. His smoking use included cigars. He started smoking about 43 years ago. He has never used smokeless tobacco. [name] reports no history of alcohol use.   Review of Systems CONSTITUTIONAL: No weight loss, fever,  chills, weakness or fatigue.  HEENT: Eyes: No visual loss, blurred vision, double vision or yellow sclerae.No hearing loss, sneezing, congestion, runny nose or sore throat.  SKIN: No rash or itching.  CARDIOVASCULAR: per hpi RESPIRATORY: No shortness of breath, cough or sputum.  GASTROINTESTINAL: No anorexia, nausea, vomiting or diarrhea. No abdominal pain or blood.  GENITOURINARY: No burning on urination, no polyuria NEUROLOGICAL: No headache, dizziness, syncope, paralysis, ataxia, numbness or tingling in the extremities. No change in bowel or bladder control.  MUSCULOSKELETAL: No muscle, back pain, joint pain or stiffness.  LYMPHATICS: No enlarged nodes. No history of splenectomy.  PSYCHIATRIC: No history of depression or anxiety.  ENDOCRINOLOGIC: No reports of sweating, cold or heat intolerance. No polyuria or polydipsia.  [name]   Physical Examination Today's Vitals   08/10/19 1356  BP: (!) 150/98  Pulse: 63  SpO2: 97%  Weight: 264 lb (119.7 kg)  Height: 6\' 3"  (1.905 m)   Body mass index is 33 kg/m.  Gen: resting comfortably, no acute distress HEENT: no scleral icterus, pupils equal round and reactive, no palptable cervical adenopathy,  CV: RRR, no m/r/g,no jvd Resp: Clear to auscultation bilaterally GI: abdomen is soft, non-tender, non-distended, normal bowel sounds, no hepatosplenomegaly MSK: extremities are warm, no edema.  Skin: warm, no rash Neuro:  no focal deficits Psych: appropriate affect   Diagnostic Studies  03/2000 Cath HEMODYNAMIC DATA: The central aortic pressure was 152/95. LV pressure  161/23.No gradient on pullback across the aortic valve.  ANGIOGRAPHIC DATA:  The left main coronary artery was free of critical disease.  The left anterior descending artery courses to the apex. There were three  diagonal branches all of modest size. No significant high-grade stenoses were  noted.  The circumflex provided his large marginal Hudsen Fei and an A-V  circumflex which  bifurcated. The distal circumflex was a relatively small caliber vessel. The  large marginal was free of significant disease.  The right coronary artery is a dominant vessel. It provided a posterior  descending and posterolateral Khristen Cheyney. There was perhaps a very mild tapered  narrowing of 10 to 20% near the junction of the proximal and mid vessel. This  did not appear to be flow limiting and did not appear to be significant.  Ventriculography in the RAO projection reveals preserved global systolic  function. Ejection fraction was 74%. No segmental abnormalities or  contractures were identified.  CONCLUSIONS:  1. Normal left ventricular function.  2. No high-grade coronary artery stenosis.  DISPOSITION: Follow up with Dr. [name] and Dr. [name].  02/2005 Cath RESULTS:  1. The aortic pressure  was 109/66 with a mean of 86.  2. The left ventricular pressure was 109/10.  3. Left main coronary artery: The left main coronary artery was free of  significant disease.  4. Left anterior descending artery: The left anterior descending artery  gave rise to four diagonal branches and three septal perforators. These  and the LAD proper were free of significant disease.  5. Circumflex artery: The circumflex artery gave rise to a ramus Toluwani Yadav,  an atrial Shakyra Mattera, a marginal Kunaal Walkins and a posterolateral Aaron Boeh. These  vessels were free of significant disease.  6. Right coronary artery: The right coronary artery was a large, dominant  vessel that gave rise to dual posterior descending branches and two  posterolateral branches. These vessels were free of significant  disease.  7. Left ventriculogram: The left ventriculogram performed in the RAO  projection showed good wall motion with no areas of hypokinesis. The  inferior wall moved well. The estimated ejection fraction was 55%.  8. Distal aortogram: A distal aortogram was performed that showed patent   renal arteries with no renal obstruction and no significant a aortoiliac  obstruction.  CONCLUSION: Normal coronary angiography and left ventricular wall motion.  RECOMMENDATIONS: Reassurance. Will make arrangements for the patient to  see Dr. Dannielle Burn in follow-up. It does not appear to be cardiac in etiology,  and Dr. Dannielle Burn can decide about further evaluation.   03/07/13 Clinic EKG NSR, PACs    11/2015 cath  The left ventricular ejection fraction is 45-50% by visual estimate.  There is mild left ventricular systolic dysfunction.  LV end diastolic pressure is normal.  There is no mitral valve regurgitation.   Essentially normal coronary arteries for age. Right dominant coronary pattern.  Low-normal to mildly depressed LV function estimated to be 45-50%. Normal LVEDP.  RECOMMENDATIONS:   No explanation for the patient's chest discomfort.  LV function is mildly depressed but not into the range suggested by the noninvasive studies.  11/2015 echo Left ventricle: LVEF is approximately 55% with distal inferior hypokineis. The cavity size was mildly dilated. Wall thickness was normal. Features are consistent with a pseudonormal left ventricular filling pattern, with concomitant abnormal relaxation and increased filling pressure (grade 2 diastolic dysfunction).   Assessment and Plan  1. Palpitatons  - mild stable symptoms, continue beta blocker  2. HTN - repeat manual bp 140/70, from recent pcp visits has been at goal - continue current meds at this time  3. Chest pain - recent chest pain symptoms. Negative cath in 2017, recent symptoms are different and progressing though somewhat mixed in description - plan for coronary CTA to further evaluate - EKG today SR, chronic lateral ST/T changes  F/u 6 months      Arnoldo Lenis, M.D.

## 2019-08-15 ENCOUNTER — Telehealth: Payer: Self-pay | Admitting: Student

## 2019-08-15 NOTE — Telephone Encounter (Signed)
Dropped of medication list   Pm  Pravastatin na 40 mg tab tamsulosin hcl .4 cap  asmanex twishthaler -220 mcg per actuation - 2 puffs  Pm dinner  Metformin hcl 500 mg tab  Am - breakfast Metformin hcl 500 mg tab Metoprolol tartrate 25 mg tab hydrochloro thiazide 25mg  Lisinopril 2.5 mg tab cholecalcif 50 mcg (d3 2000 unit tab) Aspirin 81 mg once daily  Losartan potassium 26mg  tab

## 2019-09-06 NOTE — Addendum Note (Signed)
Addended by: Barbarann Ehlers A on: 09/06/2019 11:31 AM   Modules accepted: Orders

## 2019-09-07 NOTE — Addendum Note (Signed)
Addended by: Barbarann Ehlers A on: 09/07/2019 10:10 AM   Modules accepted: Orders

## 2019-09-21 ENCOUNTER — Other Ambulatory Visit (HOSPITAL_COMMUNITY)
Admission: RE | Admit: 2019-09-21 | Discharge: 2019-09-21 | Disposition: A | Discharge status: Home / Self Care | Payer: Medicare Other | Source: Ambulatory Visit | Attending: Cardiology | Admitting: Cardiology

## 2019-09-21 DIAGNOSIS — Z01818 Encounter for other preprocedural examination: Secondary | ICD-10-CM | POA: Diagnosis present

## 2019-09-21 LAB — BASIC METABOLIC PANEL
Anion gap: 9 (ref 5–15)
BUN: 17 mg/dL (ref 8–23)
CO2: 27 mmol/L (ref 22–32)
Calcium: 9.3 mg/dL (ref 8.9–10.3)
Chloride: 102 mmol/L (ref 98–111)
Creatinine, Ser: 1.12 mg/dL (ref 0.61–1.24)
GFR calc Af Amer: >60 mL/min (ref >60)
GFR calc non Af Amer: >60 mL/min (ref >60)
Glucose, Bld: 141 mg/dL — ABNORMAL HIGH (ref 70–99)
Potassium: 3.8 mmol/L (ref 3.5–5.1)
Sodium: 138 mmol/L (ref 135–145)

## 2019-09-26 ENCOUNTER — Telehealth (HOSPITAL_COMMUNITY): Payer: Self-pay | Admitting: Emergency Medicine

## 2019-09-26 NOTE — Telephone Encounter (Signed)
Reaching out to patient to offer assistance regarding upcoming cardiac imaging study; pt verbalizes understanding of appt date/time, parking situation and where to check in, pre-test NPO status and medications ordered, and verified current allergies; name and call back number provided for further questions should they arise Jakye Mullens RN Navigator Cardiac Imaging [facility] [phone] office [phone] cell 

## 2019-09-28 ENCOUNTER — Ambulatory Visit (HOSPITAL_COMMUNITY)
Admission: RE | Admit: 2019-09-28 | Discharge: 2019-09-28 | Disposition: A | Discharge status: Home / Self Care | Payer: Medicare Other | Source: Ambulatory Visit | Attending: Cardiology | Admitting: Cardiology

## 2019-09-28 ENCOUNTER — Telehealth: Payer: Self-pay | Admitting: *Deleted

## 2019-09-28 DIAGNOSIS — I259 Chronic ischemic heart disease, unspecified: Secondary | ICD-10-CM | POA: Insufficient documentation

## 2019-09-28 DIAGNOSIS — Z01818 Encounter for other preprocedural examination: Secondary | ICD-10-CM | POA: Diagnosis present

## 2019-09-28 DIAGNOSIS — I209 Angina pectoris, unspecified: Secondary | ICD-10-CM

## 2019-09-28 DIAGNOSIS — R0789 Other chest pain: Secondary | ICD-10-CM | POA: Insufficient documentation

## 2019-09-28 MED ORDER — NITROGLYCERIN 0.4 MG SL SUBL
SUBLINGUAL_TABLET | SUBLINGUAL | Status: AC
  Administered 2019-09-28: 0.8 mg via SUBLINGUAL
  Filled 2019-09-28: qty 2

## 2019-09-28 MED ORDER — NITROGLYCERIN 0.4 MG SL SUBL: 0.8000 mg | SUBLINGUAL_TABLET | Freq: Once | SUBLINGUAL | Status: AC

## 2019-09-28 MED ORDER — IOHEXOL 350 MG/ML SOLN
100.0000 mL | Freq: Once | INTRAVENOUS | Status: AC | PRN
  Administered 2019-09-28: 100 mL via INTRAVENOUS

## 2019-09-28 NOTE — Telephone Encounter (Signed)
-----   Message from Arnoldo Lenis, MD sent at 09/26/2019  9:57 AM EDT ----- Normal labs  Zandra Abts MD

## 2019-09-28 NOTE — Telephone Encounter (Signed)
Left normal results on personal VM

## 2019-10-05 ENCOUNTER — Telehealth: Payer: Self-pay | Admitting: *Deleted

## 2019-10-05 NOTE — Telephone Encounter (Signed)
-----   Message from Arnoldo Lenis, MD sent at 10/04/2019  8:40 PM EDT ----- Normal coronary CT, no evidence of any blockages in the heart   Zandra Abts MD

## 2019-10-11 NOTE — Telephone Encounter (Signed)
Pt aware.

## 2020-01-31 ENCOUNTER — Ambulatory Visit: Payer: Medicare PPO | Admitting: Student

## 2020-01-31 ENCOUNTER — Other Ambulatory Visit: Payer: Self-pay

## 2020-01-31 ENCOUNTER — Encounter: Payer: Self-pay | Admitting: Student

## 2020-01-31 VITALS — BP 126/72 | HR 50 | Ht 75.0 in | Wt 263.8 lb

## 2020-01-31 DIAGNOSIS — E782 Mixed hyperlipidemia: Secondary | ICD-10-CM

## 2020-01-31 DIAGNOSIS — Z87898 Personal history of other specified conditions: Secondary | ICD-10-CM | POA: Diagnosis not present

## 2020-01-31 DIAGNOSIS — D1803 Hemangioma of intra-abdominal structures: Secondary | ICD-10-CM

## 2020-01-31 DIAGNOSIS — I10 Essential (primary) hypertension: Secondary | ICD-10-CM

## 2020-01-31 DIAGNOSIS — R002 Palpitations: Secondary | ICD-10-CM

## 2020-01-31 NOTE — Progress Notes (Signed)
Cardiology Office Note    Date:  01/31/2020   ID:  Mable, Lashley 1950-07-09, MRN [id number]  PCP:  Bridget Hartshorn, NP  Cardiologist: Carlyle Dolly, MD    Chief Complaint  Patient presents with  . Follow-up    6 month visit    History of Present Illness:    [name] is a 70 y.o. male with past medical history of normal cors by catheterizations in 2002, 2007, and 2017), Type 2 DM, HTN, HLD, palpitations (PAC's and PVC's), asthma, and PTSD who presents to the office today for 66-month follow-up.  He was last examined by Dr. Harl Bowie in 08/2019 and reported some occasional palpitations but denied any progression of his symptoms. He did report continual episodes of chest discomfort which could last for seconds at a time and occur at rest or with activity. A Coronary CTA was recommended for further evaluation. This showed a coronary calcium score of 0 with no evidence of CAD. The over-read did show an indeterminate lesion in the superior aspect of the liver which was felt to represent a cavernous hemangioma and could be further evaluated with a nonemergent abdominal MRI if of clinical concern.  In talking with the patient today, he reports overall doing well from a cardiac perspective since his last visit. He denies any recent chest pain or dyspnea on exertion. He does remain active around the house doing household chores and helping with yard work.  No recent orthopnea, PND or lower extremity edema. He reports his palpitations have overall been well controlled with his current medication regimen.   Past Medical History:  Diagnosis Date  . Asthma   . Chest pain    a. normal cors by catheterizations in 2002, 2007, and 2017 b. Coronary CT in 09/2019 with coronary calcium score of 0.  . Diabetes mellitus without complication ([place])   . Dyslipidemia   . Hypertension   . Sleep apnea     Past Surgical History:  Procedure Laterality Date  . CARDIAC CATHETERIZATION N/A  11/18/2015   Procedure: Left Heart Cath and Coronary Angiography;  Surgeon: Belva Crome, MD;  Location: [facility];  Service: Cardiovascular;  Laterality: N/A;    Current Medications: Outpatient Medications Prior to Visit  Medication Sig Dispense Refill  . ALPRAZolam (XANAX) 1 MG tablet Take 1 mg by mouth at bedtime as needed for anxiety.    [name] aspirin 81 MG tablet Take 81 mg by mouth daily.    . Cholecalciferol (VITAMIN D3) 2000 UNITS TABS Take 2,000 Units by mouth daily.     . hydrochlorothiazide (HYDRODIURIL) 25 MG tablet Take 25 mg by mouth daily.    [name] losartan (COZAAR) 25 MG tablet Take half tab by mouth daily    . meloxicam (MOBIC) 15 MG tablet Take 15 mg by mouth daily as needed for pain.    . metFORMIN (GLUCOPHAGE) 500 MG tablet Take 500 mg by mouth daily with breakfast.    . metoprolol succinate (TOPROL-XL) 25 MG 24 hr tablet Take 1.5 tablets (37.5 mg total) by mouth daily. 135 tablet 3  . mometasone (ASMANEX) 220 MCG/INH inhaler Inhale 2 puffs into the lungs at bedtime.     . nitroGLYCERIN (NITROSTAT) 0.4 MG SL tablet DISSOLVE ONE TABLET UNDER TONGUE EVERY 5 MINUTES UP TO 3 DOSES AS NEEDED FOR CHEST PAIN 25 tablet 3  . pravastatin (PRAVACHOL) 40 MG tablet Take 40 mg by mouth daily.     . tamsulosin (FLOMAX) 0.4  MG CAPS capsule Take 0.4 mg by mouth daily.    . traZODone (DESYREL) 50 MG tablet Take 3 tablets by mouth at bedtime.    [name] lisinopril (ZESTRIL) 2.5 MG tablet Take 2.5 mg by mouth daily.    . metoprolol tartrate (LOPRESSOR) 100 MG tablet Take 1 tablet (100 mg) 2 hours before Cardiac CT 1 tablet 0   No facility-administered medications prior to visit.     Allergies:   Sulfa antibiotics   Social History   Socioeconomic History  . Marital status: Married    Spouse name: Not on file  . Number of children: Not on file  . Years of education: Not on file  . Highest education level: Not on file  Occupational History  . Not on file  Tobacco Use  . Smoking  status: Former Smoker    Types: Cigars    Start date: 01/05/1976    Quit date: 01/05/1987    Years since quitting: 33.0  . Smokeless tobacco: Never Used  Vaping Use  . Vaping Use: Never used  Substance and Sexual Activity  . Alcohol use: No    Alcohol/week: 0.0 standard drinks  . Drug use: No  . Sexual activity: Not on file  Other Topics Concern  . Not on file  Social History Narrative  . Not on file   Social Determinants of Health   Financial Resource Strain: Not on file  Food Insecurity: Not on file  Transportation Needs: Not on file  Physical Activity: Not on file  Stress: Not on file  Social Connections: Not on file     Family History:  The patient's family history includes Diabetes in his father and sister; Heart attack in his brother, father, mother, and paternal grandfather; Heart disease in his brother, father, mother, and paternal grandfather; Hypertension in his mother; Liver cancer in his sister; Stomach cancer in his mother.   Review of Systems:   Please see the history of present illness.     General:  No chills, fever, night sweats or weight changes.  Cardiovascular:  No chest pain, dyspnea on exertion, edema, orthopnea, paroxysmal nocturnal dyspnea. Positive for palpitations.  Dermatological: No rash, lesions/masses Respiratory: No cough, dyspnea Urologic: No hematuria, dysuria Abdominal:   No nausea, vomiting, diarrhea, bright red blood per rectum, melena, or hematemesis Neurologic:  No visual changes, wkns, changes in mental status. All other systems reviewed and are otherwise negative except as noted above.   Physical Exam:    VS:  BP 126/72   Pulse (!) 50   Ht 6\' 3"  (1.905 m)   Wt 263 lb 12.8 oz (119.7 kg)   SpO2 97%   BMI 32.97 kg/m    General: Well developed, well nourished,male appearing in no acute distress. Head: Normocephalic, atraumatic. Neck: No carotid bruits. JVD not elevated.  Lungs: Respirations regular and unlabored, without wheezes  or rales.  Heart: Regular rate and rhythm. No S3 or S4.  No murmur, no rubs, or gallops appreciated. Abdomen: Appears non-distended. No obvious abdominal masses. Msk:  Strength and tone appear normal for age. No obvious joint deformities or effusions. Extremities: No clubbing or cyanosis. No lower extremity edema.  Distal pedal pulses are 2+ bilaterally. Neuro: Alert and oriented X 3. Moves all extremities spontaneously. No focal deficits noted. Psych:  Responds to questions appropriately with a normal affect. Skin: No rashes or lesions noted  Wt Readings from Last 3 Encounters:  01/31/20 263 lb 12.8 oz (119.7 kg)  08/10/19 264 lb (119.7  kg)  05/05/18 268 lb 3.2 oz (121.7 kg)      Studies/Labs Reviewed:   EKG:  EKG is not ordered today.    Recent Labs: 09/21/2019: BUN 17; Creatinine, Ser 1.12; Potassium 3.8; Sodium 138   Lipid Panel No results found for: CHOL, TRIG, HDL, CHOLHDL, VLDL, LDLCALC, LDLDIRECT  Additional studies/ records that were reviewed today include:   Cardiac Catheterization: 11/2015  The left ventricular ejection fraction is 45-50% by visual estimate.  There is mild left ventricular systolic dysfunction.  LV end diastolic pressure is normal.  There is no mitral valve regurgitation.    Essentially normal coronary arteries for age. Right dominant coronary pattern.  Low-normal to mildly depressed LV function estimated to be 45-50%. Normal LVEDP.  RECOMMENDATIONS:   No explanation for the patient's chest discomfort.  LV function is mildly depressed but not into the range suggested by the noninvasive studies.   Limited Echo: 03/2018 IMPRESSIONS    1. The left ventricle has normal systolic function, with an ejection  fraction of 55-60%. The cavity size was normal. Left ventricular diastolic  parameters were normal No evidence of left ventricular regional wall  motion abnormalities.  2. Trivial pericardial effusion is present.  3. The mitral  valve is normal in structure.  4. The tricuspid valve was normal in structure.  5. The aortic valve is tricuspid.  6. No evidence of left ventricular regional wall motion abnormalities.    Coronary CT: 09/2019 Coronary Arteries:  Normal coronary origin.  Right dominance.  RCA is a very large dominant artery that gives rise to PDA and large PLA. There is no plaque.  Left main is a large artery that gives rise to LAD and LCX arteries. Left main has no plaque.  LAD is a large vessel that gives rise to one diagonal artery and has no plaque.  LCX is a non-dominant artery that gives rise to one large OM1 branch. There is no plaque.  Other findings:  Normal pulmonary vein drainage into the left atrium.  Normal left atrial appendage without a thrombus.  Normal size of the pulmonary artery.  IMPRESSION: 1. Coronary calcium score of 0. This was 0 percentile for age and sex matched control.  2. Normal coronary origin with right dominance.  3. CAD-RADS 0. No evidence of CAD (0%). Consider non-atherosclerotic causes of chest pain.   Assessment:    1. History of chest pain   2. Palpitations   3. Essential hypertension   4. Mixed hyperlipidemia   5. Cavernous hemangioma of liver      Plan:   In order of problems listed above:  1. History of Chest Pain - He has a long history of recurrent chest pain with normal cors by catheterizations in 2002, 2007, and 2017.  Recently underwent a coronary CT in 09/2019 which showed a coronary calcium score of 0 with no evidence of CAD.  - He denies any recent anginal symptoms. Continue with risk factor modification.    2. Palpitations - The patient had PAC's and PVC's by review of notes. He reports his symptoms have overall been well-controlled and he does try to limit his caffeine intake. Continue Toprol-XL 37.5 mg daily.  3. HTN - BP is well controlled at 126/72 during today's visit. Continue current medication regimen.  Listed as being on Lisinopril and Losartan but currently on Losartan by review of Care Everywhere.   4. HLD - Followed by PCP. LDL was at 82 in 01/2020. He remains on Pravastatin 40  mg daily.  5. Cavernous Hemangioma of Liver - This was noted on the radiology over-read following his recent Coronary CT in 09/2019. This was also noted on prior imaging and felt to be benign as well. Reviewed findings with the patient today. He will continue with routine labs with his PCP and I encouraged him to make them aware of any new abdominal symptoms   Medication Adjustments/Labs and Tests Ordered: Current medicines are reviewed at length with the patient today.  Concerns regarding medicines are outlined above.  Medication changes, Labs and Tests ordered today are listed in the Patient Instructions below. Patient Instructions  Medication Instructions:   No medication changes.   Labwork:  None at this time. We will request most recent labs from your PCP.   Testing/Procedures:  None.   Follow-Up:  With Dr. Harl Bowie in 6 months.   Any Other Special Instructions Will Be Listed Below (If Applicable).     If you need a refill on your cardiac medications before your next appointment, please call your pharmacy.      Signed, Erma Heritage, PA-C  01/31/2020 4:42 PM    [facility] [address] [place], [place] [address] Phone: [phone] Fax: [phone]

## 2020-01-31 NOTE — Patient Instructions (Signed)
Medication Instructions:   No medication changes.   Labwork:  None at this time. We will request most recent labs from your PCP.   Testing/Procedures:  None.   Follow-Up:  With Dr. Harl Bowie in 6 months.   Any Other Special Instructions Will Be Listed Below (If Applicable).     If you need a refill on your cardiac medications before your next appointment, please call your pharmacy.

## 2020-02-19 LAB — COLOGUARD: COLOGUARD: NEGATIVE

## 2020-02-21 ENCOUNTER — Other Ambulatory Visit: Payer: Self-pay | Admitting: Adult Health Nurse Practitioner

## 2020-12-10 ENCOUNTER — Other Ambulatory Visit: Payer: Self-pay | Admitting: Urology

## 2020-12-10 DIAGNOSIS — N4232 Atypical small acinar proliferation of prostate: Secondary | ICD-10-CM

## 2021-01-12 ENCOUNTER — Inpatient Hospital Stay: Admission: RE | Admit: 2021-01-12 | Payer: Medicare PPO | Source: Ambulatory Visit

## 2021-01-24 ENCOUNTER — Ambulatory Visit
Admission: RE | Admit: 2021-01-24 | Discharge: 2021-01-24 | Disposition: A | Discharge status: Home / Self Care | Payer: Medicare PPO | Source: Ambulatory Visit | Attending: Urology | Admitting: Urology

## 2021-01-24 ENCOUNTER — Other Ambulatory Visit: Payer: Self-pay

## 2021-01-24 DIAGNOSIS — N4232 Atypical small acinar proliferation of prostate: Secondary | ICD-10-CM

## 2021-01-24 MED ORDER — GADOBENATE DIMEGLUMINE 529 MG/ML IV SOLN
20.0000 mL | Freq: Once | INTRAVENOUS | Status: AC | PRN
  Administered 2021-01-24: 20 mL via INTRAVENOUS

## 2021-03-04 ENCOUNTER — Telehealth: Payer: Self-pay | Admitting: Cardiology

## 2021-03-04 NOTE — Telephone Encounter (Signed)
Pt and wife who states that over the last 7-10 days pt has had chest pain. Pt reports that the pain is located to the left of the chest around the shoulder and armpit area. They report that the pain only last a few secs. And the he did not take nitro for this pain. Pt denies SOB, and N/V at the time of the chest pain. Denies anything making the pain worst or better. Will forward to Dr. Harl Bowie and DOD.  ?

## 2021-03-04 NOTE — Telephone Encounter (Signed)
Pt's wife notified of Dr. Oralia Rud note. Wife voiced understanding and that they will seek medical attention for angina symptoms.   ?

## 2021-03-04 NOTE — Telephone Encounter (Signed)
Pt send mychart message stating-  ? ?Tightness in chest - under the arm pit. Pain is sharp then goes away. Maybe lasting a few seconds. ? ? ?

## 2021-03-09 ENCOUNTER — Encounter: Payer: Self-pay | Admitting: Cardiology

## 2021-03-09 ENCOUNTER — Ambulatory Visit: Payer: Medicare PPO | Admitting: Cardiology

## 2021-03-09 VITALS — BP 136/78 | HR 56 | Ht 75.0 in | Wt 251.2 lb

## 2021-03-09 DIAGNOSIS — R002 Palpitations: Secondary | ICD-10-CM | POA: Diagnosis not present

## 2021-03-09 DIAGNOSIS — R079 Chest pain, unspecified: Secondary | ICD-10-CM

## 2021-03-09 DIAGNOSIS — I10 Essential (primary) hypertension: Secondary | ICD-10-CM

## 2021-03-09 NOTE — Patient Instructions (Signed)
Medication Instructions:  Continue all current medications.   Labwork: none  Testing/Procedures: none  Follow-Up: 6 months   Any Other Special Instructions Will Be Listed Below (If Applicable).   If you need a refill on your cardiac medications before your next appointment, please call your pharmacy.  

## 2021-03-09 NOTE — Progress Notes (Signed)
Clinical Summary Mr. Bonura is a 71 y.o.male seen today for follow up of the following medical problems.    1. Palpitations   - 7 day event monitor showed  NSR, sinus brady high 50s, and occasional PACs correlating with his symptoms. - he had done well on verapamil and Toprol XL, he states however when he changed his meds over to the [place] his Toprol XL was changed to lopressor 12.'5mg'$  bid and since then symptoms have started again.    - some palpitatons at times, some increase in frequency. Few skips at a time.  - stable symptoms overall.  - occasional palpitations, varies in frequency. Lasts just a few seconds. Can occur at rest or with activity.  - compliant with toprol.  - 2 cups of coffee in AM, infrequent sodas, infrequent tea, no energy drinks, no EtOH     2. HTN - compliant with meds  - at pcp visit 02/2021 130/80   3. Hyperlipidemia - compliant with statin, labs are followed at [facility]   4. 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 No current ischemia.  - 11/2015 heart cath: without significant CAD - 11/2015 echo LVEF 55%, grade II diastolic dysfunction   - dull pain left sided. 4-5/10 in severity. No other associated symptoms. Not positional. Lasts few seconds, occurs few times a week. Can occur at rest or with exertion.     09/2019 coronary CTA: no CAD, calcium score 0  - no chest pressure. Occasional shoulder pain, lasts just a few seconds. No other asocaited symptoms       5. 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     Past Medical History:  Diagnosis Date   Asthma    Chest pain    a. normal cors by catheterizations in 2002, 2007, and 2017 b. Coronary CT in 09/2019 with coronary calcium score of 0.   Diabetes mellitus without complication ([facility])    Dyslipidemia    Hypertension     Sleep apnea      Allergies  Allergen Reactions   Sulfa Antibiotics Other (See Comments)    Unknown     Current Outpatient Medications  Medication Sig Dispense Refill   ALPRAZolam (XANAX) 1 MG tablet Take 1 mg by mouth at bedtime as needed for anxiety.     aspirin 81 MG tablet Take 81 mg by mouth daily.     Cholecalciferol (VITAMIN D3) 2000 UNITS TABS Take 2,000 Units by mouth daily.      hydrochlorothiazide (HYDRODIURIL) 25 MG tablet Take 25 mg by mouth daily.     losartan (COZAAR) 25 MG tablet Take half tab by mouth daily     meloxicam (MOBIC) 15 MG tablet Take 15 mg by mouth daily as needed for pain.     metFORMIN (GLUCOPHAGE) 500 MG tablet Take 500 mg by mouth daily with breakfast.     metoprolol succinate (TOPROL-XL) 25 MG 24 hr tablet Take 1.5 tablets (37.5 mg total) by mouth daily. 135 tablet 3   mometasone (ASMANEX) 220 MCG/INH inhaler Inhale 2 puffs into the lungs at bedtime.      nitroGLYCERIN (NITROSTAT) 0.4 MG SL tablet DISSOLVE ONE TABLET UNDER TONGUE EVERY 5 MINUTES UP TO 3 DOSES AS NEEDED FOR CHEST PAIN 25 tablet 3  pravastatin (PRAVACHOL) 40 MG tablet Take 40 mg by mouth daily.      tamsulosin (FLOMAX) 0.4 MG CAPS capsule Take 0.4 mg by mouth daily.     traZODone (DESYREL) 50 MG tablet Take 3 tablets by mouth at bedtime.     No current facility-administered medications for this visit.     Past Surgical History:  Procedure Laterality Date   CARDIAC CATHETERIZATION N/A 11/18/2015   Procedure: Left Heart Cath and Coronary Angiography;  Surgeon: Belva Crome, MD;  Location: [facility];  Service: Cardiovascular;  Laterality: N/A;     Allergies  Allergen Reactions   Sulfa Antibiotics Other (See Comments)    Unknown      Family History  Problem Relation Age of Onset   Hypertension Mother    Heart attack Mother    Stomach cancer Mother    Heart disease Mother    Diabetes Father    Heart attack Father    Heart disease Father    Diabetes Sister     Liver cancer Sister    Heart attack Brother    Heart disease Brother    Heart attack Paternal Grandfather    Heart disease Paternal Grandfather      Social History Mr. Doolan reports that he quit smoking about 34 years ago. His smoking use included cigars. He started smoking about 45 years ago. He has never used smokeless tobacco. Mr. Cotten reports no history of alcohol use.   Review of Systems CONSTITUTIONAL: No weight loss, fever, chills, weakness or fatigue.  HEENT: Eyes: No visual loss, blurred vision, double vision or yellow sclerae.No hearing loss, sneezing, congestion, runny nose or sore throat.  SKIN: No rash or itching.  CARDIOVASCULAR: per hpi RESPIRATORY: No shortness of breath, cough or sputum.  GASTROINTESTINAL: No anorexia, nausea, vomiting or diarrhea. No abdominal pain or blood.  GENITOURINARY: No burning on urination, no polyuria NEUROLOGICAL: No headache, dizziness, syncope, paralysis, ataxia, numbness or tingling in the extremities. No change in bowel or bladder control.  MUSCULOSKELETAL: per hpi LYMPHATICS: No enlarged nodes. No history of splenectomy.  PSYCHIATRIC: No history of depression or anxiety.  ENDOCRINOLOGIC: No reports of sweating, cold or heat intolerance. No polyuria or polydipsia.  [name]   Physical Examination Today's Vitals   03/09/21 0959  BP: 140/70  Pulse: (!) 56  SpO2: 97%  Weight: 251 lb 3.2 oz (113.9 kg)  Height: '6\' 3"'$  (1.905 m)   Body mass index is 31.4 kg/m.  Gen: resting comfortably, no acute distress HEENT: no scleral icterus, pupils equal round and reactive, no palptable cervical adenopathy,  CV: RRR, no m/r/g no jvd Resp: Clear to auscultation bilaterally GI: abdomen is soft, non-tender, non-distended, normal bowel sounds, no hepatosplenomegaly MSK: extremities are warm, no edema.  Skin: warm, no rash Neuro:  no focal deficits Psych: appropriate affect   Diagnostic Studies 03/2000 Cath   HEMODYNAMIC DATA: The central  aortic pressure was 152/95. LV pressure   161/23.No gradient on pullback across the aortic valve.   ANGIOGRAPHIC DATA:   The left main coronary artery was free of critical disease.   The left anterior descending artery courses to the apex. There were three   diagonal branches all of modest size. No significant high-grade stenoses were   noted.   The circumflex provided his large marginal Larenzo Caples and an A-V circumflex which   bifurcated. The distal circumflex was a relatively small caliber vessel. The   large marginal was free of significant disease.  The right coronary artery is a dominant vessel. It provided a posterior   descending and posterolateral Ivorie Uplinger. There was perhaps a very mild tapered   narrowing of 10 to 20% near the junction of the proximal and mid vessel. This   did not appear to be flow limiting and did not appear to be significant.   Ventriculography in the RAO projection reveals preserved global systolic   function. Ejection fraction was 74%. No segmental abnormalities or   contractures were identified.   CONCLUSIONS:   1. Normal left ventricular function.   2. No high-grade coronary artery stenosis.   DISPOSITION: Follow up with Dr. [name] and Dr. [name].   02/2005 Cath   RESULTS:   1. The aortic pressure was 109/66 with a mean of 86.   2. The left ventricular pressure was 109/10.   3. Left main coronary artery: The left main coronary artery was free of   significant disease.   4. Left anterior descending artery: The left anterior descending artery   gave rise to four diagonal branches and three septal perforators. These   and the LAD proper were free of significant disease.   5. Circumflex artery: The circumflex artery gave rise to a ramus Hartlyn Reigel,   an atrial Shaakira Borrero, a marginal Sheyenne Konz and a posterolateral Hiram Mciver. These   vessels were free of significant disease.   6. Right coronary artery: The right coronary artery was a large, dominant   vessel that gave rise  to dual posterior descending branches and two   posterolateral branches. These vessels were free of significant   disease.   7. Left ventriculogram: The left ventriculogram performed in the RAO   projection showed good wall motion with no areas of hypokinesis. The   inferior wall moved well. The estimated ejection fraction was 55%.   8. Distal aortogram: A distal aortogram was performed that showed patent   renal arteries with no renal obstruction and no significant a aortoiliac   obstruction.   CONCLUSION: Normal coronary angiography and left ventricular wall motion.   RECOMMENDATIONS: Reassurance. Will make arrangements for the patient to   see Dr. Dannielle Burn in follow-up. It does not appear to be cardiac in etiology,   and Dr. Dannielle Burn can decide about further evaluation.     03/07/13 Clinic EKG   NSR, PACs        11/2015 cath The left ventricular ejection fraction is 45-50% by visual estimate. There is mild left ventricular systolic dysfunction. LV end diastolic pressure is normal. There is no mitral valve regurgitation.   Essentially normal coronary arteries for age. Right dominant coronary pattern. Low-normal to mildly depressed LV function estimated to be 45-50%. Normal LVEDP.   RECOMMENDATIONS:   No explanation for the patient's chest discomfort. LV function is mildly depressed but not into the range suggested by the noninvasive studies.   11/2015 echo Left ventricle:  LVEF is approximately 55% with distal inferior hypokineis. The cavity size was mildly dilated. Wall thickness was normal. Features are consistent with a pseudonormal left ventricular filling pattern, with concomitant abnormal relaxation and increased filling pressure (grade 2 diastolic dysfunction).   09/2019 coronary CTA IMPRESSION: 1. Coronary calcium score of 0. This was 0 percentile for age and sex matched control.   2. Normal coronary origin with right dominance.   3. CAD-RADS 0. No evidence of CAD  (0%). Consider non-atherosclerotic causes of chest pain.   Assessment and Plan  1. Palpitatons   - mild fairly infrequent symptoms, low normal  HRs would not increase toprol - continue current meds   2. HTN -borderline bp today, at recent pcp visit was at goal. Continue to monitor at this time, continue current meds    3. Chest pain -several year history of intermittent chest pains - caht 2017 without signifciant disease, coronary CTA 2021 coronary Ca score of 0 - contiue risk factor modification.       Arnoldo Lenis, M.D.

## 2021-09-15 ENCOUNTER — Encounter: Payer: Self-pay | Admitting: Cardiology

## 2021-09-15 ENCOUNTER — Encounter: Payer: Self-pay | Admitting: *Deleted

## 2021-09-15 ENCOUNTER — Ambulatory Visit: Payer: Medicare PPO | Attending: Cardiology | Admitting: Cardiology

## 2021-09-15 VITALS — BP 132/60 | HR 62 | Ht 75.0 in | Wt 237.0 lb

## 2021-09-15 DIAGNOSIS — R079 Chest pain, unspecified: Secondary | ICD-10-CM

## 2021-09-15 DIAGNOSIS — R002 Palpitations: Secondary | ICD-10-CM

## 2021-09-15 DIAGNOSIS — R0602 Shortness of breath: Secondary | ICD-10-CM | POA: Diagnosis not present

## 2021-09-15 DIAGNOSIS — I10 Essential (primary) hypertension: Secondary | ICD-10-CM

## 2021-09-15 NOTE — Patient Instructions (Addendum)
Medication Instructions:  Continue all current medications.  Labwork: none  Testing/Procedures: Your physician has requested that you have an echocardiogram. Echocardiography is a painless test that uses sound waves to create images of your heart. It provides your doctor with information about the size and shape of your heart and how well your heart's chambers and valves are working. This procedure takes approximately one hour. There are no restrictions for this procedure. Office will contact with results via phone, letter or mychart.     Follow-Up: 6 months   Any Other Special Instructions Will Be Listed Below (If Applicable). Please continue to increase hydration and keep [place] updated on symptoms.    If you need a refill on your cardiac medications before your next appointment, please call your pharmacy.

## 2021-09-15 NOTE — Progress Notes (Signed)
Clinical Summary [name] is a 71 y.o.male seen today for follow up of the following medical problems.    1. Palpitations   - 7 day event monitor showed  NSR, sinus brady high 50s, and occasional PACs correlating with his symptoms. - he had done well on verapamil and Toprol XL, he states however when he changed his meds over to the [place] his Toprol XL was changed to lopressor 12.'5mg'$  bid and since then symptoms have started again.      - occasional palpitations, varies in frequency. Lasts just a few seconds. Can occur at rest or with activity.  - compliant with toprol.  - 2 cups of coffee in AM, infrequent sodas, infrequent tea, no energy drinks, no EtOH  - no recent palpitations - compliant with meds      2. HTN   -compliant with meds   3. Hyperlipidemia - reports recent labs with pcp   4. 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 No current ischemia.  - 11/2015 heart cath: without significant CAD - 11/2015 echo LVEF 55%, grade II diastolic dysfunction   - dull pain left sided. 4-5/10 in severity. No other associated symptoms. Not positional. Lasts few seconds, occurs few times a week. Can occur at rest or with exertion.     09/2019 coronary CTA: no CAD, calcium score 0   - no chest pressure. Occasional shoulder pain, lasts just a few seconds. No other asocaited symptoms  - chronic stable symptoms unchanged.  - does have some recent SOB/DOE      5. AAA screen - former smoker x 20 years, male over 31   - 07/2016 AAA [place] neg   6. Dizziness - often occurs with standing - reports some limit hydration.    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      Past Medical History:  Diagnosis Date   Asthma    Chest pain    a. normal cors by catheterizations in 2002, 2007, and 2017 b. Coronary CT in 09/2019 with coronary calcium score of 0.   Diabetes mellitus without  complication (HCC)    Dyslipidemia    Hypertension    Sleep apnea      Allergies  Allergen Reactions   Sulfa Antibiotics Other (See Comments)    Unknown     Current Outpatient Medications  Medication Sig Dispense Refill   ALPRAZolam (XANAX) 1 MG tablet Take 1 mg by mouth at bedtime as needed for anxiety.     aspirin 81 MG tablet Take 81 mg by mouth daily.     Cholecalciferol (VITAMIN D3) 2000 UNITS TABS Take 2,000 Units by mouth daily.      hydrochlorothiazide (HYDRODIURIL) 25 MG tablet Take 25 mg by mouth daily.     losartan (COZAAR) 25 MG tablet Take half tab by mouth daily     meloxicam (MOBIC) 15 MG tablet Take 15 mg by mouth daily as needed for pain.     metFORMIN (GLUCOPHAGE) 500 MG tablet Take 500 mg by mouth daily with breakfast.     metoprolol succinate (TOPROL-XL) 25 MG 24 hr tablet Take 1.5 tablets (37.5 mg total) by mouth daily. 135 tablet 3   mometasone (ASMANEX) 220 MCG/INH inhaler Inhale 2 puffs into the lungs at bedtime.      nitroGLYCERIN (NITROSTAT) 0.4 MG SL tablet DISSOLVE ONE TABLET UNDER TONGUE EVERY 5 MINUTES UP  TO 3 DOSES AS NEEDED FOR CHEST PAIN 25 tablet 3   pravastatin (PRAVACHOL) 40 MG tablet Take 40 mg by mouth daily.      tamsulosin (FLOMAX) 0.4 MG CAPS capsule Take 0.4 mg by mouth daily.     No current facility-administered medications for this visit.     Past Surgical History:  Procedure Laterality Date   CARDIAC CATHETERIZATION N/A 11/18/2015   Procedure: Left Heart Cath and Coronary Angiography;  Surgeon: Johnny Crome, MD;  Location: [facility];  Service: Cardiovascular;  Laterality: N/A;     Allergies  Allergen Reactions   Sulfa Antibiotics Other (See Comments)    Unknown      Family History  Problem Relation Age of Onset   Hypertension Mother    Heart attack Mother    Stomach cancer Mother    Heart disease Mother    Diabetes Father    Heart attack Father    Heart disease Father    Diabetes Sister    Liver cancer  Sister    Heart attack Brother    Heart disease Brother    Heart attack Paternal Grandfather    Heart disease Paternal Grandfather      Social History [name] reports that he quit smoking about 34 years ago. His smoking use included cigars. He started smoking about 45 years ago. He has never used smokeless tobacco. [name] reports no history of alcohol use.   Review of Systems CONSTITUTIONAL: No weight loss, fever, chills, weakness or fatigue.  HEENT: Eyes: No visual loss, blurred vision, double vision or yellow sclerae.No hearing loss, sneezing, congestion, runny nose or sore throat.  SKIN: No rash or itching.  CARDIOVASCULAR: per hpi RESPIRATORY: per hpi GASTROINTESTINAL: No anorexia, nausea, vomiting or diarrhea. No abdominal pain or blood.  GENITOURINARY: No burning on urination, no polyuria NEUROLOGICAL: No headache, dizziness, syncope, paralysis, ataxia, numbness or tingling in the extremities. No change in bowel or bladder control.  MUSCULOSKELETAL: No muscle, back pain, joint pain or stiffness.  LYMPHATICS: No enlarged nodes. No history of splenectomy.  PSYCHIATRIC: No history of depression or anxiety.  ENDOCRINOLOGIC: No reports of sweating, cold or heat intolerance. No polyuria or polydipsia.  [name]   Physical Examination Today's Vitals   09/15/21 0952  BP: 132/60  Pulse: 62  SpO2: 97%  Weight: 237 lb (107.5 kg)  Height: '6\' 3"'$  (1.905 m)   Body mass index is 29.62 kg/m.  Gen: resting comfortably, no acute distress HEENT: no scleral icterus, pupils equal round and reactive, no palptable cervical adenopathy,  CV: RRR, no m/rg, no jvd Resp: Clear to auscultation bilaterally GI: abdomen is soft, non-tender, non-distended, normal bowel sounds, no hepatosplenomegaly MSK: extremities are warm, no edema.  Skin: warm, no rash Neuro:  no focal deficits Psych: appropriate affect   Diagnostic Studies   03/2000 Cath   HEMODYNAMIC DATA: The central aortic pressure  was 152/95. LV pressure   161/23.No gradient on pullback across the aortic valve.   ANGIOGRAPHIC DATA:   The left main coronary artery was free of critical disease.   The left anterior descending artery courses to the apex. There were three   diagonal branches all of modest size. No significant high-grade stenoses were   noted.   The circumflex provided his large marginal Keilee Denman and an A-V circumflex which   bifurcated. The distal circumflex was a relatively small caliber vessel. The   large marginal was free of significant disease.   The right coronary  artery is a dominant vessel. It provided a posterior   descending and posterolateral Danea Manter. There was perhaps a very mild tapered   narrowing of 10 to 20% near the junction of the proximal and mid vessel. This   did not appear to be flow limiting and did not appear to be significant.   Ventriculography in the RAO projection reveals preserved global systolic   function. Ejection fraction was 74%. No segmental abnormalities or   contractures were identified.   CONCLUSIONS:   1. Normal left ventricular function.   2. No high-grade coronary artery stenosis.   DISPOSITION: Follow up with Dr. [name] and Dr. [name].   02/2005 Cath   RESULTS:   1. The aortic pressure was 109/66 with a mean of 86.   2. The left ventricular pressure was 109/10.   3. Left main coronary artery: The left main coronary artery was free of   significant disease.   4. Left anterior descending artery: The left anterior descending artery   gave rise to four diagonal branches and three septal perforators. These   and the LAD proper were free of significant disease.   5. Circumflex artery: The circumflex artery gave rise to a ramus Korissa Horsford,   an atrial Wymon Swaney, a marginal Rosaura Bolon and a posterolateral Shavar Gorka. These   vessels were free of significant disease.   6. Right coronary artery: The right coronary artery was a large, dominant   vessel that gave rise to dual  posterior descending branches and two   posterolateral branches. These vessels were free of significant   disease.   7. Left ventriculogram: The left ventriculogram performed in the RAO   projection showed good wall motion with no areas of hypokinesis. The   inferior wall moved well. The estimated ejection fraction was 55%.   8. Distal aortogram: A distal aortogram was performed that showed patent   renal arteries with no renal obstruction and no significant a aortoiliac   obstruction.   CONCLUSION: Normal coronary angiography and left ventricular wall motion.   RECOMMENDATIONS: Reassurance. Will make arrangements for the patient to   see Dr. Dannielle Burn in follow-up. It does not appear to be cardiac in etiology,   and Dr. Dannielle Burn can decide about further evaluation.     03/07/13 Clinic EKG   NSR, PACs        11/2015 cath The left ventricular ejection fraction is 45-50% by visual estimate. There is mild left ventricular systolic dysfunction. LV end diastolic pressure is normal. There is no mitral valve regurgitation.   Essentially normal coronary arteries for age. Right dominant coronary pattern. Low-normal to mildly depressed LV function estimated to be 45-50%. Normal LVEDP.   RECOMMENDATIONS:   No explanation for the patient's chest discomfort. LV function is mildly depressed but not into the range suggested by the noninvasive studies.   11/2015 echo Left ventricle:  LVEF is approximately 55% with distal inferior hypokineis. The cavity size was mildly dilated. Wall thickness was normal. Features are consistent with a pseudonormal left ventricular filling pattern, with concomitant abnormal relaxation and increased filling pressure (grade 2 diastolic dysfunction).   09/2019 coronary CTA IMPRESSION: 1. Coronary calcium score of 0. This was 0 percentile for age and sex matched control.   2. Normal coronary origin with right dominance.   3. CAD-RADS 0. No evidence of CAD (0%).  Consider non-atherosclerotic causes of chest pain.    Assessment and Plan   1. Palpitatons   - doing well, continue current meds  2. HTN -at goal, continue current meds    3. Chest pain -several year history of intermittent chest pains - caht 2017 without signifciant disease, coronary CTA 2021 coronary Ca score of 0 - chronic stable symptoms, continue to monitor  4. Orthostatic dizziness - orthostatics today are negative though history suggestive of orthostatic diziness - encouraged increased hydration, if ongoing would wean his HCTZ  5. SOB/DOE - obtain echo      Arnoldo Lenis, M.D.

## 2021-09-21 ENCOUNTER — Ambulatory Visit: Payer: Medicare PPO | Attending: Cardiology

## 2021-09-21 DIAGNOSIS — R0602 Shortness of breath: Secondary | ICD-10-CM | POA: Diagnosis not present

## 2021-09-21 LAB — ECHOCARDIOGRAM COMPLETE
AR max vel: 3.53 cm2
AV Peak grad: 5.9 mmHg
Ao pk vel: 1.22 m/s
Area-P 1/2: 3.99 cm2
Calc EF: 63.4 %
MV M vel: 3.38 m/s
MV Peak grad: 45.6 mmHg
S' Lateral: 4.45 cm
Single Plane A2C EF: 62.4 %
Single Plane A4C EF: 62.8 %

## 2021-10-07 ENCOUNTER — Encounter: Payer: Self-pay | Admitting: *Deleted

## 2022-04-04 ENCOUNTER — Emergency Department (HOSPITAL_COMMUNITY): Payer: Medicare PPO

## 2022-04-04 ENCOUNTER — Encounter (HOSPITAL_COMMUNITY): Payer: Self-pay | Admitting: *Deleted

## 2022-04-04 ENCOUNTER — Emergency Department (HOSPITAL_COMMUNITY)
Admission: EM | Admit: 2022-04-04 | Discharge: 2022-04-04 | Disposition: A | Discharge status: Home / Self Care | Payer: Medicare PPO | Attending: Emergency Medicine | Admitting: Emergency Medicine

## 2022-04-04 ENCOUNTER — Other Ambulatory Visit: Payer: Self-pay

## 2022-04-04 DIAGNOSIS — E1165 Type 2 diabetes mellitus with hyperglycemia: Secondary | ICD-10-CM | POA: Diagnosis not present

## 2022-04-04 DIAGNOSIS — R079 Chest pain, unspecified: Secondary | ICD-10-CM

## 2022-04-04 DIAGNOSIS — R072 Precordial pain: Secondary | ICD-10-CM | POA: Diagnosis present

## 2022-04-04 DIAGNOSIS — Z7982 Long term (current) use of aspirin: Secondary | ICD-10-CM | POA: Insufficient documentation

## 2022-04-04 DIAGNOSIS — Z79899 Other long term (current) drug therapy: Secondary | ICD-10-CM | POA: Insufficient documentation

## 2022-04-04 DIAGNOSIS — Z7984 Long term (current) use of oral hypoglycemic drugs: Secondary | ICD-10-CM | POA: Diagnosis not present

## 2022-04-04 DIAGNOSIS — I10 Essential (primary) hypertension: Secondary | ICD-10-CM | POA: Insufficient documentation

## 2022-04-04 LAB — BASIC METABOLIC PANEL
Anion gap: 9 (ref 5–15)
BUN: 27 mg/dL — ABNORMAL HIGH (ref 8–23)
CO2: 23 mmol/L (ref 22–32)
Calcium: 9.1 mg/dL (ref 8.9–10.3)
Chloride: 103 mmol/L (ref 98–111)
Creatinine, Ser: 1.28 mg/dL — ABNORMAL HIGH (ref 0.61–1.24)
GFR, Estimated: 59 mL/min — ABNORMAL LOW (ref >60)
Glucose, Bld: 135 mg/dL — ABNORMAL HIGH (ref 70–99)
Potassium: 3.7 mmol/L (ref 3.5–5.1)
Sodium: 135 mmol/L (ref 135–145)

## 2022-04-04 LAB — CBG MONITORING, ED: Glucose-Capillary: 136 mg/dL — ABNORMAL HIGH (ref 70–99)

## 2022-04-04 LAB — CBC
HCT: 38.7 % — ABNORMAL LOW (ref 39.0–52.0)
Hemoglobin: 12.8 g/dL — ABNORMAL LOW (ref 13.0–17.0)
MCH: 28.4 pg (ref 26.0–34.0)
MCHC: 33.1 g/dL (ref 30.0–36.0)
MCV: 85.8 fL (ref 80.0–100.0)
Platelets: 223 10*3/uL (ref 150–400)
RBC: 4.51 MIL/uL (ref 4.22–5.81)
RDW: 12.8 % (ref 11.5–15.5)
WBC: 8.4 10*3/uL (ref 4.0–10.5)
nRBC: 0 % (ref 0.0–0.2)

## 2022-04-04 LAB — TROPONIN I (HIGH SENSITIVITY)
Troponin I (High Sensitivity): 29 ng/L — ABNORMAL HIGH (ref <18)
Troponin I (High Sensitivity): 25 ng/L — ABNORMAL HIGH (ref <18)

## 2022-04-04 MED ORDER — ASPIRIN 81 MG PO CHEW: 324.0000 mg | CHEWABLE_TABLET | Freq: Once | ORAL | Status: DC

## 2022-04-04 NOTE — ED Triage Notes (Signed)
Pt c/o left side chest pain that was present when he woke up this am; pt describes the pain as dull and denies any nausea, sob or diaphoresis

## 2022-04-04 NOTE — ED Provider Notes (Signed)
[facility] Provider Note   CSN: [id number] Arrival date & time: 04/04/22  [id number]     History  Chief Complaint  Patient presents with   Chest Pain    [name] is a 72 y.o. male.  He has a history of chest pain diabetes hypertension hyperlipidemia.  He said he woke up this morning and did not feel energetic.  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  He cannot really describe it he said it lasts a few seconds and comes and goes.  He does not think he has had it before although he said he follows with cardiology Dr. Harl Bowie.  He has had a cardiac cath in the past that did not show any significant coronary disease although this was in 2017.  He says he takes a daily aspirin.  Not currently having any chest pain.  Was not associated with any dizziness lightheadedness shortness of breath diaphoresis.  The history is provided by the patient and the spouse.  Chest Pain Pain location:  Substernal area Pain radiates to:  Does not radiate Pain severity:  Moderate Onset quality:  Gradual Duration:  3 hours Timing:  Intermittent Progression:  Unchanged Chronicity:  New Relieved by:  None tried Worsened by:  Nothing Ineffective treatments:  None tried Associated symptoms: no abdominal pain, no cough, no diaphoresis, no fever, no heartburn, no nausea, no shortness of breath and no vomiting   Risk factors: diabetes mellitus, high cholesterol, hypertension and male sex   Risk factors: no smoking        Home Medications Prior to Admission medications   Medication Sig Start Date End Date Taking? Authorizing Provider  aspirin 81 MG tablet Take 81 mg by mouth 2 (two) times daily.    [provider]  Cholecalciferol (VITAMIN D3) 2000 UNITS TABS Take 2,000 Units by mouth daily.     [provider]  hydrochlorothiazide (HYDRODIURIL) 25 MG tablet Take 25 mg by mouth  daily.    [provider]  losartan (COZAAR) 25 MG tablet Take half tab by mouth daily 05/18/19   [provider]  metFORMIN (GLUCOPHAGE) 500 MG tablet Take 500 mg by mouth daily with breakfast.    [provider]  metoprolol succinate (TOPROL-XL) 25 MG 24 hr tablet Take 1.5 tablets (37.5 mg total) by mouth daily. 05/05/18   Strader, Fransisco Hertz, PA-C  mometasone [facility]) 220 MCG/INH inhaler Inhale 2 puffs into the lungs at bedtime.     [provider]  nitroGLYCERIN (NITROSTAT) 0.4 MG SL tablet DISSOLVE ONE TABLET UNDER TONGUE EVERY 5 MINUTES UP TO 3 DOSES AS NEEDED FOR CHEST PAIN 07/10/19   Arnoldo Lenis, MD  pravastatin (PRAVACHOL) 40 MG tablet Take 40 mg by mouth daily.  04/01/14   [provider]      Allergies    Sulfa antibiotics    Review of Systems   Review of Systems  Constitutional:  Negative for diaphoresis and fever.  Respiratory:  Negative for cough and shortness of breath.   Cardiovascular:  Positive for chest pain.  Gastrointestinal:  Negative for abdominal pain, heartburn, nausea and vomiting.    Physical Exam Updated Vital Signs BP (!) 156/77   Pulse (!) 107   Resp 15   Ht 6\' 3"  (1.905 m)   Wt 115.7 kg   SpO2 99%   BMI 31.87 kg/m  Physical Exam Vitals  and nursing note reviewed.  Constitutional:      General: He is not in acute distress.    Appearance: He is well-developed.  HENT:     Head: Normocephalic and atraumatic.  Eyes:     Conjunctiva/sclera: Conjunctivae normal.  Cardiovascular:     Rate and Rhythm: Normal rate and regular rhythm.     Heart sounds: Normal heart sounds. No murmur heard. Pulmonary:     Effort: Pulmonary effort is normal. No respiratory distress.     Breath sounds: Normal breath sounds.  Abdominal:     Palpations: Abdomen is soft.     Tenderness: There is no abdominal tenderness.  Musculoskeletal:        General: No swelling. Normal range of motion.     Cervical back: Neck supple.      Right lower leg: No tenderness. No edema.     Left lower leg: No tenderness. No edema.  Skin:    General: Skin is warm and dry.     Capillary Refill: Capillary refill takes less than 2 seconds.  Neurological:     General: No focal deficit present.     Mental Status: He is alert.     ED Results / Procedures / Treatments   Labs (all labs ordered are listed, but only abnormal results are displayed) Labs Reviewed  BASIC METABOLIC PANEL - Abnormal; Notable for the following components:      Result Value   Glucose, Bld 135 (*)    BUN 27 (*)    Creatinine, Ser 1.28 (*)    GFR, Estimated 59 (*)    All other components within normal limits  CBC - Abnormal; Notable for the following components:   Hemoglobin 12.8 (*)    HCT 38.7 (*)    All other components within normal limits  CBG MONITORING, ED - Abnormal; Notable for the following components:   Glucose-Capillary 136 (*)    All other components within normal limits  TROPONIN I (HIGH SENSITIVITY) - Abnormal; Notable for the following components:   Troponin I (High Sensitivity) 29 (*)    All other components within normal limits  TROPONIN I (HIGH SENSITIVITY) - Abnormal; Notable for the following components:   Troponin I (High Sensitivity) 25 (*)    All other components within normal limits    EKG EKG Interpretation  Date/Time:  Sunday April 04 2022 08:16:15 EDT Ventricular Rate:  61 PR Interval:  168 QRS Duration: 96 QT Interval:  421 QTC Calculation: 424 R Axis:   258 Text Interpretation: Sinus rhythm Supraventricular bigeminy Inferior infarct, age indeterminate Lateral leads are also involved No significant change since prior 3/20 Confirmed by Aletta Edouard [phone]) on 04/04/2022 8:20:22 AM  Radiology DG Chest Port 1 View  Result Date: 04/04/2022 CLINICAL DATA:  Left-sided chest pain. EXAM: PORTABLE CHEST 1 VIEW COMPARISON:  03/12/2018 FINDINGS: AP portable radiograph with leads and wires project over the chest. Midline  trachea. Normal heart size. Atherosclerosis in the transverse aorta. No pleural effusion or pneumothorax. Clear lungs. IMPRESSION: No active disease. Electronically Signed   By: Abigail Miyamoto M.D.   On: 04/04/2022 08:35    Procedures Procedures    Medications Ordered in ED Medications - No data to display   ED Course/ Medical Decision Making/ A&P Clinical Course as of 04/04/22 1727  Sun Apr 04, 2022  0839 Chest x-ray interpreted by me as no acute infiltrate.  Awaiting radiology reading. [MB]  [id number] Patient's troponins have been mildly elevated but flat.  He  said he has had 1 brief episode of pain while he has been here and otherwise feels back to baseline.  He has an appointment with Dr. Harl Bowie tomorrow and I not see any obvious reason to admit him at this time.  Recommended continuous appointment tomorrow with cardiology and returning if any worsening symptoms [MB]    Clinical Course User Index [MB] Hayden Rasmussen, MD                             Medical Decision Making Amount and/or Complexity of Data Reviewed Labs: ordered. Radiology: ordered.  Risk OTC drugs.   This patient complains of intermittent chest pain; this involves an extensive number of treatment Options and is a complaint that carries with it a high risk of complications and morbidity. The differential includes ACS, pneumonia, pneumothorax, PE, muscular, palpitations, reflux  I ordered, reviewed and interpreted labs, which included CBC with normal white count stable hemoglobin, chemistries with elevated glucose elevated creatinine stable, troponins mildly elevated but flat I ordered medication oral aspirin and reviewed PMP when indicated. I ordered imaging studies which included chest x-ray and I independently    visualized and interpreted imaging which showed no acute findings Additional history obtained from patient's wife and family members Previous records obtained and reviewed in epic including prior  cardiology notes Cardiac monitoring reviewed, normal sinus rhythm Social determinants considered, no significant barriers Critical Interventions: None  After the interventions stated above, I reevaluated the patient and found patient to have improved symptoms and no further chest pain Admission and further testing considered, no indications for admission or further workup at this time.  From prior cardiology notes no evidence of active ischemic tissue.  He has an appointment with cardiology coming up so feel he can be discharged to follow-up with them for further evaluation.  Patient comfortable plan for outpatient follow-up and understands return instructions.         Final Clinical Impression(s) / ED Diagnoses Final diagnoses:  Nonspecific chest pain    Rx / DC Orders ED Discharge Orders     None         Hayden Rasmussen, MD 04/04/22 1729

## 2022-04-04 NOTE — ED Notes (Signed)
Pt ambulated to the restroom ans is getting a urine sample

## 2022-04-04 NOTE — Discharge Instructions (Signed)
You were seen in the emergency department for some chest pain in your left shoulder and chest.  You had blood work EKG and chest x-ray that did not show any obvious signs of heart attack.  Please continue regular medications and see Dr. Harl Bowie tomorrow for further follow-up.  Return to the emergency department if any worsening or concerning symptoms

## 2022-04-05 ENCOUNTER — Encounter: Payer: Self-pay | Admitting: Cardiology

## 2022-04-05 ENCOUNTER — Ambulatory Visit: Payer: Medicare PPO | Attending: Cardiology | Admitting: Cardiology

## 2022-04-05 VITALS — BP 134/72 | HR 60 | Ht 75.0 in | Wt 253.2 lb

## 2022-04-05 DIAGNOSIS — R002 Palpitations: Secondary | ICD-10-CM

## 2022-04-05 DIAGNOSIS — E782 Mixed hyperlipidemia: Secondary | ICD-10-CM

## 2022-04-05 DIAGNOSIS — I10 Essential (primary) hypertension: Secondary | ICD-10-CM | POA: Diagnosis not present

## 2022-04-05 DIAGNOSIS — R079 Chest pain, unspecified: Secondary | ICD-10-CM | POA: Diagnosis not present

## 2022-04-05 NOTE — Patient Instructions (Signed)
Medication Instructions:  Continue all current medications.   Labwork: none  Testing/Procedures: none  Follow-Up: 6 months   Any Other Special Instructions Will Be Listed Below (If Applicable).   If you need a refill on your cardiac medications before your next appointment, please call your pharmacy.  

## 2022-04-05 NOTE — Progress Notes (Signed)
Clinical Summary Mr. Fagans is a 72 y.o.male seen today for follow up of the following medical problems.    1. Palpitations   - 7 day event monitor showed  NSR, sinus brady high 50s, and occasional PACs correlating with his symptoms.     - no significant symptoms, compliant with meds       2. HTN - he is compliantwit meds   3. Hyperlipidemia - reports recent labs with pcp - Jan 2024 TC 164 TG 69 HLD 55 LDL 96   4. 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 No current ischemia.  - 11/2015 heart cath: without significant CAD - 11/2015 echo LVEF 55%, grade II diastolic dysfunction   - dull pain left sided. 4-5/10 in severity. No other associated symptoms. Not positional. Lasts few seconds, occurs few times a week. Can occur at rest or with exertion.     09/2019 coronary CTA: no CAD, calcium score 0   - ER visti yesterday with chest pain - EKG SR, chronic TWIs.   - trops 29-->25. He has had prior mild trop elevation during evaluation   - left sided pain while at church. Dull pain, 4-5/10 in severity. No other associated symptoms. Worst with position. Episodes would last about 20-30 minutes. Felt better this AM but not completely resolved. Worst with using left arm.  - similar to prior chest pains.  -DOE over the last year.     5. AAA screen - former smoker x 20 years, male over 52   - 07/2016 AAA [place] neg    6. Dizziness - often occurs with standing - reports some limit hydration.    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 He is a Building surveyor.      Past Medical History:  Diagnosis Date   Asthma    Chest pain    a. normal cors by catheterizations in 2002, 2007, and 2017 b. Coronary CT in 09/2019 with coronary calcium score of 0.   Diabetes mellitus without complication (HCC)    Dyslipidemia    Hypertension    Sleep apnea      Allergies  Allergen Reactions   Sulfa  Antibiotics Rash     Current Outpatient Medications  Medication Sig Dispense Refill   aspirin 81 MG tablet Take 81 mg by mouth 2 (two) times daily.     Cholecalciferol (VITAMIN D3) 2000 UNITS TABS Take 2,000 Units by mouth daily.      hydrochlorothiazide (HYDRODIURIL) 25 MG tablet Take 25 mg by mouth daily.     losartan (COZAAR) 25 MG tablet Take half tab by mouth daily     metFORMIN (GLUCOPHAGE) 500 MG tablet Take 500 mg by mouth daily with breakfast.     metoprolol succinate (TOPROL-XL) 25 MG 24 hr tablet Take 1.5 tablets (37.5 mg total) by mouth daily. 135 tablet 3   mometasone (ASMANEX) 220 MCG/INH inhaler Inhale 2 puffs into the lungs at bedtime.      nitroGLYCERIN (NITROSTAT) 0.4 MG SL tablet DISSOLVE ONE TABLET UNDER TONGUE EVERY 5 MINUTES UP TO 3 DOSES AS NEEDED FOR CHEST PAIN 25 tablet 3   pravastatin (PRAVACHOL) 40 MG tablet Take 40 mg by mouth daily.      No current facility-administered medications for this visit.     Past Surgical History:  Procedure Laterality Date   CARDIAC CATHETERIZATION N/A 11/18/2015   Procedure: Left Heart Cath and Coronary  Angiography;  Surgeon: Belva Crome, MD;  Location: [facility];  Service: Cardiovascular;  Laterality: N/A;     Allergies  Allergen Reactions   Sulfa Antibiotics Rash      Family History  Problem Relation Age of Onset   Hypertension Mother    Heart attack Mother    Stomach cancer Mother    Heart disease Mother    Diabetes Father    Heart attack Father    Heart disease Father    Diabetes Sister    Liver cancer Sister    Heart attack Brother    Heart disease Brother    Heart attack Paternal Grandfather    Heart disease Paternal Grandfather      Social History Mr. Heppe reports that he quit smoking about 35 years ago. His smoking use included cigars. He started smoking about 46 years ago. He has never been exposed to tobacco smoke. He has never used smokeless tobacco. Mr. Shaheen reports no history of  alcohol use.   Review of Systems CONSTITUTIONAL: No weight loss, fever, chills, weakness or fatigue.  HEENT: Eyes: No visual loss, blurred vision, double vision or yellow sclerae.No hearing loss, sneezing, congestion, runny nose or sore throat.  SKIN: No rash or itching.  CARDIOVASCULAR: per hpi RESPIRATORY: No shortness of breath, cough or sputum.  GASTROINTESTINAL: No anorexia, nausea, vomiting or diarrhea. No abdominal pain or blood.  GENITOURINARY: No burning on urination, no polyuria NEUROLOGICAL: No headache, dizziness, syncope, paralysis, ataxia, numbness or tingling in the extremities. No change in bowel or bladder control.  MUSCULOSKELETAL: No muscle, back pain, joint pain or stiffness.  LYMPHATICS: No enlarged nodes. No history of splenectomy.  PSYCHIATRIC: No history of depression or anxiety.  ENDOCRINOLOGIC: No reports of sweating, cold or heat intolerance. No polyuria or polydipsia.  [name]   Physical Examination Today's Vitals   04/05/22 1011  BP: 134/72  Pulse: 60  SpO2: 97%  Weight: 253 lb 3.2 oz (114.9 kg)  Height: 6\' 3"  (1.905 m)   Body mass index is 31.65 kg/m.  Gen: resting comfortably, no acute distress HEENT: no scleral icterus, pupils equal round and reactive, no palptable cervical adenopathy,  CV: RRR, no mrg, no jvd Resp: Clear to auscultation bilaterally GI: abdomen is soft, non-tender, non-distended, normal bowel sounds, no hepatosplenomegaly MSK: extremities are warm, no edema.  Skin: warm, no rash Neuro:  no focal deficits Psych: appropriate affect   Diagnostic Studies 03/2000 Cath   HEMODYNAMIC DATA: The central aortic pressure was 152/95. LV pressure   161/23.No gradient on pullback across the aortic valve.   ANGIOGRAPHIC DATA:   The left main coronary artery was free of critical disease.   The left anterior descending artery courses to the apex. There were three   diagonal branches all of modest size. No significant high-grade stenoses were    noted.   The circumflex provided his large marginal Grizelda Piscopo and an A-V circumflex which   bifurcated. The distal circumflex was a relatively small caliber vessel. The   large marginal was free of significant disease.   The right coronary artery is a dominant vessel. It provided a posterior   descending and posterolateral Simora Dingee. There was perhaps a very mild tapered   narrowing of 10 to 20% near the junction of the proximal and mid vessel. This   did not appear to be flow limiting and did not appear to be significant.   Ventriculography in the RAO projection reveals preserved global systolic   function.  Ejection fraction was 74%. No segmental abnormalities or   contractures were identified.   CONCLUSIONS:   1. Normal left ventricular function.   2. No high-grade coronary artery stenosis.   DISPOSITION: Follow up with Dr. [name] and Dr. [name].   02/2005 Cath   RESULTS:   1. The aortic pressure was 109/66 with a mean of 86.   2. The left ventricular pressure was 109/10.   3. Left main coronary artery: The left main coronary artery was free of   significant disease.   4. Left anterior descending artery: The left anterior descending artery   gave rise to four diagonal branches and three septal perforators. These   and the LAD proper were free of significant disease.   5. Circumflex artery: The circumflex artery gave rise to a ramus Alwin Lanigan,   an atrial Ayjah Show, a marginal Lawayne Hartig and a posterolateral Mckinzi Eriksen. These   vessels were free of significant disease.   6. Right coronary artery: The right coronary artery was a large, dominant   vessel that gave rise to dual posterior descending branches and two   posterolateral branches. These vessels were free of significant   disease.   7. Left ventriculogram: The left ventriculogram performed in the RAO   projection showed good wall motion with no areas of hypokinesis. The   inferior wall moved well. The estimated ejection fraction was 55%.    8. Distal aortogram: A distal aortogram was performed that showed patent   renal arteries with no renal obstruction and no significant a aortoiliac   obstruction.   CONCLUSION: Normal coronary angiography and left ventricular wall motion.   RECOMMENDATIONS: Reassurance. Will make arrangements for the patient to   see Dr. Dannielle Burn in follow-up. It does not appear to be cardiac in etiology,   and Dr. Dannielle Burn can decide about further evaluation.     03/07/13 Clinic EKG   NSR, PACs        11/2015 cath The left ventricular ejection fraction is 45-50% by visual estimate. There is mild left ventricular systolic dysfunction. LV end diastolic pressure is normal. There is no mitral valve regurgitation.   Essentially normal coronary arteries for age. Right dominant coronary pattern. Low-normal to mildly depressed LV function estimated to be 45-50%. Normal LVEDP.   RECOMMENDATIONS:   No explanation for the patient's chest discomfort. LV function is mildly depressed but not into the range suggested by the noninvasive studies.   11/2015 echo Left ventricle:  LVEF is approximately 55% with distal inferior hypokineis. The cavity size was mildly dilated. Wall thickness was normal. Features are consistent with a pseudonormal left ventricular filling pattern, with concomitant abnormal relaxation and increased filling pressure (grade 2 diastolic dysfunction).   09/2019 coronary CTA IMPRESSION: 1. Coronary calcium score of 0. This was 0 percentile for age and sex matched control.   2. Normal coronary origin with right dominance.   3. CAD-RADS 0. No evidence of CAD (0%). Consider non-atherosclerotic causes of chest pain.        Assessment and Plan   1. Palpitatons   - no symptoms, continue toprol  EKG today sSR, PACs, chronic ST/T changesw   2. HTN -bp is at goal, continue current meds    3. Chest pain -several year history of intermittent chest pains - caht 2017 without signifciant  disease, coronary CTA 2021 coronary Ca score of 0 - ER visit yesterday with atypical chest pain. Of not has chronic mild trop elevation over the years. No repeat ischemic testing  planned at this time.    4. SOB/DOE - obtain echo - chronic symptoms, recent cardiac testing including echo and coronary CTA benign - suspect deconditioning, he is up 16 lbs since last year - no further cardiac testing is planned.      Arnoldo Lenis, M.D.

## 2023-06-21 ENCOUNTER — Telehealth: Payer: Self-pay | Admitting: Cardiology

## 2023-06-21 NOTE — Telephone Encounter (Signed)
FYI.  °Contacted patient regarding recall appointment, patient notified our office they did not wish to keep this appointment at this time.  Deleted recall from system. °

## 2023-11-26 LAB — COLOGUARD: COLOGUARD: POSITIVE — AB

## 2024-01-09 ENCOUNTER — Encounter: Payer: Self-pay | Admitting: Adult Health

## 2024-01-09 ENCOUNTER — Other Ambulatory Visit: Payer: Self-pay | Admitting: Adult Health

## 2024-01-09 DIAGNOSIS — R972 Elevated prostate specific antigen [PSA]: Secondary | ICD-10-CM

## 2024-02-15 ENCOUNTER — Other Ambulatory Visit
